# Patient Record
Sex: Male | Born: 1985 | Race: White | Hispanic: No | Marital: Single | Smoking: Current every day smoker
Health system: Southern US, Community
[De-identification: ages and names within clinical notes are randomized; demographics above are authoritative.]

## PROBLEM LIST (undated history)

## (undated) DIAGNOSIS — F845 Asperger's syndrome: Secondary | ICD-10-CM

## (undated) DIAGNOSIS — R55 Syncope and collapse: Secondary | ICD-10-CM

## (undated) HISTORY — DX: Syncope and collapse: R55

## (undated) HISTORY — PX: OTHER SURGICAL HISTORY: SHX169

---

## 2011-06-19 ENCOUNTER — Emergency Department: Payer: Self-pay | Admitting: *Deleted

## 2011-07-14 ENCOUNTER — Emergency Department: Payer: Self-pay | Admitting: Emergency Medicine

## 2012-01-09 ENCOUNTER — Emergency Department: Payer: Self-pay | Admitting: Emergency Medicine

## 2012-04-16 ENCOUNTER — Emergency Department: Payer: Self-pay | Admitting: *Deleted

## 2013-08-23 ENCOUNTER — Emergency Department: Payer: Self-pay | Admitting: Emergency Medicine

## 2013-09-08 ENCOUNTER — Emergency Department: Payer: Self-pay | Admitting: Emergency Medicine

## 2014-04-02 LAB — HM HIV SCREENING LAB: HM HIV Screening: NEGATIVE

## 2015-10-27 ENCOUNTER — Encounter: Payer: Self-pay | Admitting: Medical Oncology

## 2015-10-27 ENCOUNTER — Emergency Department
Admission: EM | Admit: 2015-10-27 | Discharge: 2015-10-27 | Disposition: A | Discharge status: Home / Self Care | Payer: Self-pay | Attending: Emergency Medicine | Admitting: Emergency Medicine

## 2015-10-27 DIAGNOSIS — R112 Nausea with vomiting, unspecified: Secondary | ICD-10-CM

## 2015-10-27 DIAGNOSIS — F17200 Nicotine dependence, unspecified, uncomplicated: Secondary | ICD-10-CM | POA: Insufficient documentation

## 2015-10-27 DIAGNOSIS — K529 Noninfective gastroenteritis and colitis, unspecified: Secondary | ICD-10-CM | POA: Insufficient documentation

## 2015-10-27 DIAGNOSIS — R197 Diarrhea, unspecified: Secondary | ICD-10-CM

## 2015-10-27 LAB — COMPREHENSIVE METABOLIC PANEL
ALBUMIN: 4.2 g/dL (ref 3.5–5.0)
ALK PHOS: 63 U/L (ref 38–126)
ALT: 20 U/L (ref 17–63)
ANION GAP: 4 — AB (ref 5–15)
AST: 21 U/L (ref 15–41)
BUN: 15 mg/dL (ref 6–20)
CALCIUM: 9.3 mg/dL (ref 8.9–10.3)
CO2: 25 mmol/L (ref 22–32)
Chloride: 108 mmol/L (ref 101–111)
Creatinine, Ser: 0.94 mg/dL (ref 0.61–1.24)
GFR calc Af Amer: >60 mL/min (ref >60)
GFR calc non Af Amer: >60 mL/min (ref >60)
GLUCOSE: 97 mg/dL (ref 65–99)
POTASSIUM: 4.3 mmol/L (ref 3.5–5.1)
SODIUM: 137 mmol/L (ref 135–145)
Total Bilirubin: 0.4 mg/dL (ref 0.3–1.2)
Total Protein: 7.5 g/dL (ref 6.5–8.1)

## 2015-10-27 LAB — URINALYSIS COMPLETE WITH MICROSCOPIC (ARMC ONLY)
BACTERIA UA: NONE SEEN
Bilirubin Urine: NEGATIVE
Glucose, UA: NEGATIVE mg/dL
HGB URINE DIPSTICK: NEGATIVE
Ketones, ur: NEGATIVE mg/dL
LEUKOCYTES UA: NEGATIVE
NITRITE: NEGATIVE
PH: 7 (ref 5.0–8.0)
PROTEIN: NEGATIVE mg/dL
SPECIFIC GRAVITY, URINE: 1.017 (ref 1.005–1.030)

## 2015-10-27 LAB — CBC
HEMATOCRIT: 43.3 % (ref 40.0–52.0)
HEMOGLOBIN: 15.1 g/dL (ref 13.0–18.0)
MCH: 30.2 pg (ref 26.0–34.0)
MCHC: 34.8 g/dL (ref 32.0–36.0)
MCV: 86.8 fL (ref 80.0–100.0)
Platelets: 212 10*3/uL (ref 150–440)
RBC: 4.99 MIL/uL (ref 4.40–5.90)
RDW: 13.1 % (ref 11.5–14.5)
WBC: 6.9 10*3/uL (ref 3.8–10.6)

## 2015-10-27 LAB — LIPASE, BLOOD: Lipase: 21 U/L (ref 11–51)

## 2015-10-27 MED ORDER — SODIUM CHLORIDE 0.9 % IV BOLUS (SEPSIS): 1000.0000 mL | Freq: Once | INTRAVENOUS | Status: DC

## 2015-10-27 MED ORDER — ONDANSETRON HCL 4 MG/2ML IJ SOLN: 4.0000 mg | Freq: Once | INTRAMUSCULAR | Status: DC

## 2015-10-27 MED ORDER — LOPERAMIDE HCL 2 MG PO TABS: 2.0000 mg | ORAL_TABLET | Freq: Four times a day (QID) | ORAL | Status: DC | PRN

## 2015-10-27 MED ORDER — LOPERAMIDE HCL 2 MG PO CAPS
4.0000 mg | ORAL_CAPSULE | Freq: Once | ORAL | Status: DC
  Filled 2015-10-27: qty 2

## 2015-10-27 MED ORDER — ONDANSETRON 4 MG PO TBDP: 4.0000 mg | ORAL_TABLET | Freq: Three times a day (TID) | ORAL | Status: DC | PRN

## 2015-10-27 NOTE — ED Notes (Signed)
Pt reports nvd, headache and abd pain since Thursday night.

## 2015-10-27 NOTE — Discharge Instructions (Signed)

## 2015-10-27 NOTE — ED Notes (Signed)
MD at bedside. 

## 2015-10-27 NOTE — ED Provider Notes (Addendum)
[facility] Emergency Department Provider Note  Time seen: 3:58 PM  I have reviewed the triage vital signs and the nursing notes.   HISTORY  Chief Complaint Diarrhea; Headache; and Emesis    HPI Paul Booker is a 30 y.o. male with no past medical history who presents the emergency department 3 days of nausea, vomiting, diarrhea. According to the patient his wife came down with nausea, vomiting, diarrhea last week, she was just getting over it when he began with nausea, vomiting, diarrhea. States the vomiting has largely resolved until today when he was at work at an episode of vomiting so they sent him home. Continues to state very watery diarrhea. States mild epigastric pain. Denies any chest pain, cough, trouble breathing. Does state mild headache but he believes is due to the vomiting and dehydration.     History reviewed. No pertinent past medical history.  There are no active problems to display for this patient.   No past surgical history on file.  No current outpatient prescriptions on file.  Allergies Review of patient's allergies indicates no known allergies.  No family history on file.  Social History Social History  Substance Use Topics  . Smoking status: Current Every Day Smoker  . Smokeless tobacco: None  . Alcohol Use: None    Review of Systems Constitutional: Negative for fever Cardiovascular: Negative for chest pain. Respiratory: Negative for shortness of breath. Gastrointestinal: Mild epigastric pain. Positive for nausea, vomiting, diarrhea Genitourinary: Negative for dysuria. Neurological: Negative for headache 10-point ROS otherwise negative.  ____________________________________________   PHYSICAL EXAM:  VITAL SIGNS: ED Triage Vitals  Enc Vitals Group     BP 10/27/15 1503 105/57 mmHg     Pulse Rate 10/27/15 1503 79     Resp 10/27/15 1503 18     Temp 10/27/15 1503 98.2 F (36.8 C)     Temp Source 10/27/15 1503  Oral     SpO2 10/27/15 1503 98 %     Weight 10/27/15 1503 170 lb (77.111 kg)     Height 10/27/15 1503  (1.905 m)     Head Cir --      Peak Flow --      Pain Score 10/27/15 1504 1     Pain Loc --      Pain Edu? --      Excl. in GC? --     Constitutional: Alert and oriented. Well appearing and in no distress. Eyes: Normal exam ENT   Head: Normocephalic and atraumatic.   Mouth/Throat: Mucous membranes are moist. Cardiovascular: Normal rate, regular rhythm. No murmur Respiratory: Normal respiratory effort without tachypnea nor retractions. Breath sounds are clear  Gastrointestinal: Soft, mild epigastric tenderness. No rebound or guarding. No distention. Musculoskeletal: Nontender with normal range of motion in all extremities. No lower extremity tenderness or edema. Neurologic:  Normal speech and language. No gross focal neurologic deficits  Skin:  Skin is warm, dry and intact.  Psychiatric: Mood and affect are normal. Speech and behavior are normal.   ____________________________________________    INITIAL IMPRESSION / ASSESSMENT AND PLAN / ED COURSE  Pertinent labs & imaging results that were available during my care of the patient were reviewed by me and considered in my medical decision making (see chart for details).  Patient presents the emergency department nausea, vomiting, diarrhea. States his wife had similar symptoms just before he began with his symptoms. Suspect likely gastroenteritis, possibly normal virus. We will IV hydrate, treat with Zofran, and loperamide.  Overall the patient appears well. Moist mucous membranes on exam.  Labs are normal. We'll discharge home with Zofran and loperamide. Patient agreeable to plan.  ____________________________________________   FINAL CLINICAL IMPRESSION(S) / ED DIAGNOSES  Gastroenteritis Nausea vomiting diarrhea   Minna AntisKevin Mertie Haslem, MD 10/27/15 1600  Minna AntisKevin Stephonie Wilcoxen, MD 10/27/15 [phone]

## 2016-04-08 ENCOUNTER — Emergency Department
Admission: EM | Admit: 2016-04-08 | Discharge: 2016-04-08 | Disposition: A | Discharge status: Home / Self Care | Payer: Self-pay | Attending: Emergency Medicine | Admitting: Emergency Medicine

## 2016-04-08 ENCOUNTER — Emergency Department: Payer: Self-pay

## 2016-04-08 ENCOUNTER — Encounter: Payer: Self-pay | Admitting: Emergency Medicine

## 2016-04-08 DIAGNOSIS — F17200 Nicotine dependence, unspecified, uncomplicated: Secondary | ICD-10-CM | POA: Insufficient documentation

## 2016-04-08 DIAGNOSIS — R55 Syncope and collapse: Secondary | ICD-10-CM | POA: Insufficient documentation

## 2016-04-08 DIAGNOSIS — W1839XA Other fall on same level, initial encounter: Secondary | ICD-10-CM | POA: Insufficient documentation

## 2016-04-08 DIAGNOSIS — S01419A Laceration without foreign body of unspecified cheek and temporomandibular area, initial encounter: Secondary | ICD-10-CM | POA: Insufficient documentation

## 2016-04-08 DIAGNOSIS — Z23 Encounter for immunization: Secondary | ICD-10-CM | POA: Insufficient documentation

## 2016-04-08 DIAGNOSIS — S0181XA Laceration without foreign body of other part of head, initial encounter: Secondary | ICD-10-CM

## 2016-04-08 DIAGNOSIS — Y93E5 Activity, floor mopping and cleaning: Secondary | ICD-10-CM | POA: Insufficient documentation

## 2016-04-08 DIAGNOSIS — S0990XA Unspecified injury of head, initial encounter: Secondary | ICD-10-CM | POA: Insufficient documentation

## 2016-04-08 DIAGNOSIS — Y92000 Kitchen of unspecified non-institutional (private) residence as  the place of occurrence of the external cause: Secondary | ICD-10-CM | POA: Insufficient documentation

## 2016-04-08 DIAGNOSIS — Y990 Civilian activity done for income or pay: Secondary | ICD-10-CM | POA: Insufficient documentation

## 2016-04-08 MED ORDER — TETANUS-DIPHTH-ACELL PERTUSSIS 5-2.5-18.5 LF-MCG/0.5 IM SUSP
0.5000 mL | Freq: Once | INTRAMUSCULAR | Status: AC
  Administered 2016-04-08: 0.5 mL via INTRAMUSCULAR
  Filled 2016-04-08: qty 0.5

## 2016-04-08 MED ORDER — LIDOCAINE HCL (PF) 1 % IJ SOLN
INTRAMUSCULAR | Status: AC
  Filled 2016-04-08: qty 5

## 2016-04-08 NOTE — ED Notes (Signed)
Pt to ed with c/o laceration/puncture wound to forehead, bleeding controlled.  Pt reports he was at work but not clocked in and hit his head on the refrigerator.  Pt alert and oriented.  Admits to using marijuana and reports "I do not want it to be filed worker's comp because I will lose my job"  Pt behavior within normal limits.  Pupils equal and reactive.

## 2016-04-08 NOTE — ED Notes (Signed)
Pt to ct scan.  Alert and ambulatory.

## 2016-04-08 NOTE — Discharge Instructions (Signed)
You have been seen today in the Emergency Department (ED)  for syncope (passing out).  Your workup including labs and EKG show reassuring results.  Your symptoms may be due to mild dehydration, so it is important that you drink plenty of non-alcoholic fluids.  Given that you have had all to pull episodes of passing out in the past, we recommend that you contact the cardiologist listed in this documentation to schedule a follow-up appointment.  Additionally you should set up a primary care doctor for all of your health care needs.  Read through the included information and have her sutures removed in 5-7 days.  Return immediately to the emergency department if you are worried about infection or if you develop any new or worsening symptoms that concern you.

## 2016-04-08 NOTE — ED Provider Notes (Signed)
[facility] Emergency Department Provider Note  ____________________________________________   First MD Initiated Contact with Patient 04/08/16 1652     (approximate)  I have reviewed the triage vital signs and the nursing notes.   HISTORY  Chief Complaint Head Laceration and Loss of Consciousness    HPI Paul Booker is a 30 y.o. male with no significant past medical history who presents for evaluation of a head injury with laceration to his forehead after a loss of consciousness.He reports that he has had multiple syncopal episodes in the past, usually when exerting himself.  This is been happening for years.  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  He states that the next thing he knew he was falling and hit his forehead on the floor.  He has a laceration to his forehead but he is alert and oriented at this time.  He stated that everything happened very quickly.  The pain in his forehead as moderate.  He has no pain in his neck, chest, abdomen, or pelvis.  He did not sustain any wounds to his hands.  He is not up-to-date on his tetanus vaccination for at least 10 years.  The bleeding is well-controlled at this time.   History reviewed. No pertinent past medical history.  There are no active problems to display for this patient.   History reviewed. No pertinent surgical history.  Prior to Admission medications   Medication Sig Start Date End Date Taking? Authorizing Provider  loperamide (IMODIUM A-D) 2 MG tablet Take 1 tablet (2 mg total) by mouth 4 (four) times daily as needed for diarrhea or loose stools. 10/27/15   Minna Antis, MD  ondansetron (ZOFRAN ODT) 4 MG disintegrating tablet Take 1 tablet (4 mg total) by mouth every 8 (eight) hours as needed for nausea or vomiting. 10/27/15   Minna Antis, MD    Allergies Review of patient's allergies indicates no  known allergies.  History reviewed. No pertinent family history.  Social History Social History  Substance Use Topics  . Smoking status: Current Every Day Smoker  . Smokeless tobacco: Never Used  . Alcohol use No  Uses marijuana frequently.  Review of Systems Constitutional: No fever/chills Eyes: No visual changes. ENT: No sore throat. Cardiovascular: Denies chest pain. +syncope prior to fall Respiratory: Denies shortness of breath. Gastrointestinal: No abdominal pain.  No nausea, no vomiting.  No diarrhea.  No constipation. Genitourinary: Negative for dysuria. Musculoskeletal: Negative for back pain. Pain in forehead after fall w/ head injury Skin: Negative for rash. Neurological: Negative for headaches, focal weakness or numbness.  10-point ROS otherwise negative.  ____________________________________________   PHYSICAL EXAM:  VITAL SIGNS: ED Triage Vitals [04/08/16 1639]  Enc Vitals Group     BP 96/82     Pulse Rate 74     Resp 18     Temp 97.8 F (36.6 C)     Temp Source Oral     SpO2 99 %     Weight 180 lb (81.6 kg)     Height 6\' 3"  (1.905 m)     Head Circumference      Peak Flow      Pain Score      Pain Loc      Pain Edu?      Excl. in GC?     Constitutional: Alert and oriented. Well appearing and in no acute distress.  Eyes: Conjunctivae are normal. PERRL. EOMI. Head: Bruising and a laceration to the right side of his forehead.  See the procedure note for details regarding the laceration. Nose: No congestion/rhinnorhea. Mouth/Throat: Mucous membranes are moist.  Oropharynx non-erythematous. Neck: No stridor.  No meningeal signs.  No cervical spine tenderness to palpation. Cardiovascular: Normal rate, regular rhythm. Good peripheral circulation. Grossly normal heart sounds. Respiratory: Normal respiratory effort.  No retractions. Lungs CTAB. Gastrointestinal: Soft and nontender. No distention.  Musculoskeletal: No lower extremity tenderness nor edema.  No gross deformities of extremities. Neurologic:  Normal speech and language. No gross focal neurologic deficits are appreciated.  Skin:  Skin is warm, dry and intact. No rash noted. Psychiatric: Mood and affect are normal. Speech and behavior are normal.  ____________________________________________   LABS (all labs ordered are listed, but only abnormal results are displayed)  Labs Reviewed - No data to display ____________________________________________  EKG  ED ECG REPORT I, Nusrat Encarnacion, the attending physician, personally viewed and interpreted this ECG.  Date: 04/08/2016 EKG Time: 19:20 Rate: 58 Rhythm: Borderline sinus bradycardia QRS Axis: normal Intervals: normal ST/T Wave abnormalities: normal Conduction Disturbances: none Narrative Interpretation: unremarkable with no evidence of Brugada type I, 2, nor 3  ____________________________________________  RADIOLOGY   Ct Head Wo Contrast  Result Date: 04/08/2016 CLINICAL DATA:  Syncopal episode EXAM: CT HEAD WITHOUT CONTRAST TECHNIQUE: Contiguous axial images were obtained from the base of the skull through the vertex without intravenous contrast. COMPARISON:  07/03/2014 FINDINGS: Brain: No evidence of acute infarction, hemorrhage, hydrocephalus, extra-axial collection or mass lesion/mass effect. Vascular: No hyperdense vessel or unexpected calcification. Skull: Normal. Negative for fracture or focal lesion. Sinuses/Orbits: No acute finding. Other: Right frontal scalp injury is noted. IMPRESSION: Right frontal scalp injury. No acute intracranial abnormality is noted. Electronically Signed   By: Alcide CleverMark  Lukens M.D.   On: 04/08/2016 17:58    ____________________________________________   PROCEDURES  Procedure(s) performed:   Marland Kitchen.Marland Kitchen.Laceration Repair Date/Time: 04/08/2016 8:55 PM Performed by: Loleta RoseFORBACH, Dajanee Voorheis Authorized by: Loleta RoseFORBACH, Ena Demary   Consent:    Consent obtained:  Verbal   Consent given by:  Patient   Risks  discussed:  Infection, pain, retained foreign body, poor cosmetic result and poor wound healing Anesthesia (see MAR for exact dosages):    Anesthesia method:  Local infiltration   Local anesthetic:  Lidocaine 1% w/o epi Laceration details:    Location:  Face   Face location:  Forehead (middle, just above nose)   Length (cm):  1.9 (stellate - 1.9 is longest dimension) Repair type:    Repair type:  Simple Exploration:    Hemostasis achieved with:  Direct pressure   Wound exploration: entire depth of wound probed and visualized     Contaminated: no   Treatment:    Area cleansed with:  Saline   Amount of cleaning:  Extensive   Irrigation solution:  Sterile saline   Visualized foreign bodies/material removed: no   Skin repair:    Repair method:  Sutures and tissue adhesive (applied tissue adhesive to remaining areas after 3 sutures to hold together stellate laceration)   Suture size:  4-0   Suture material:  Nylon   Suture technique:  Simple interrupted   Number of sutures:  3 Approximation:    Approximation:  Close   Vermilion border: well-aligned   Post-procedure details:    Dressing:  Sterile dressing   Patient tolerance of procedure:  Tolerated well, no immediate complications      Critical Care performed:  No ____________________________________________   INITIAL IMPRESSION / ASSESSMENT AND PLAN / ED COURSE  Pertinent labs & imaging results that were available during my care of the patient were reviewed by me and considered in my medical decision making (see chart for details).  Given the history and nature of the injury, will obtain head CT.  Giving Tdap, will suture wound.  Need EKG given history of multiple syncopal episodes with exertion; need to evaluate for Brugada.   Clinical Course  Comment By Time  The patient's CT scan was unremarkable.  His EKG was also unremarkable with no evidence of Brugada syndrome or other potentially life-threatening cause of syncope.   He has had these episodes multiple times in the past and I encouraged him to follow up with cardiologist and to establish primary care doctor.  I gave him my usual and customary sutured wound precautions and head injury precautions.  He and his girlfriend understand and agree with the plan. Loleta Rose, MD 09/06 2058    ____________________________________________  FINAL CLINICAL IMPRESSION(S) / ED DIAGNOSES  Final diagnoses:  Facial laceration, initial encounter  Syncope and collapse  Head injury, initial encounter     MEDICATIONS GIVEN DURING THIS VISIT:  Medications  lidocaine (PF) (XYLOCAINE) 1 % injection (not administered)  Tdap (BOOSTRIX) injection 0.5 mL (0.5 mLs Intramuscular Given 04/08/16 1735)     NEW OUTPATIENT MEDICATIONS STARTED DURING THIS VISIT:  New Prescriptions   No medications on file    Modified Medications   No medications on file    Discontinued Medications   No medications on file     Note:  This document was prepared using Dragon voice recognition software and may include unintentional dictation errors.    Loleta Rose, MD 04/08/16 2102

## 2016-04-08 NOTE — ED Notes (Signed)
Discharge instructions reviewed with patient. Questions fielded by this RN. Patient verbalizes understanding of instructions. Patient discharged home in stable condition per Forbach MD . No acute distress noted at time of discharge.   

## 2016-04-08 NOTE — ED Triage Notes (Signed)
Pt was at work when he thinks he had a syncopal episode.  He hit is head on something. He is unclear about how he fell. Pt is poor historian at this time.  Pt states he was not clocked in at the time of the accident. Pt tearful in triage. Pt states he did "take something" but he is not saying what. Pt states that he feels that what he smoked should be legal.

## 2016-04-09 ENCOUNTER — Telehealth: Payer: Self-pay

## 2016-04-09 NOTE — Telephone Encounter (Signed)
lmov to call back and schedule ED visit on 04/08/16  Seen for Loss of conscious

## 2016-04-20 ENCOUNTER — Ambulatory Visit: Payer: Self-pay | Admitting: Cardiology

## 2016-04-27 ENCOUNTER — Ambulatory Visit (INDEPENDENT_AMBULATORY_CARE_PROVIDER_SITE_OTHER): Payer: Self-pay | Admitting: Cardiology

## 2016-04-27 ENCOUNTER — Encounter: Payer: Self-pay | Admitting: Cardiology

## 2016-04-27 VITALS — BP 121/77 | HR 77 | Ht 75.0 in | Wt 176.8 lb

## 2016-04-27 DIAGNOSIS — I951 Orthostatic hypotension: Secondary | ICD-10-CM

## 2016-04-27 DIAGNOSIS — Z7189 Other specified counseling: Secondary | ICD-10-CM

## 2016-04-27 DIAGNOSIS — R55 Syncope and collapse: Secondary | ICD-10-CM

## 2016-04-27 DIAGNOSIS — Z7689 Persons encountering health services in other specified circumstances: Secondary | ICD-10-CM

## 2016-04-27 NOTE — Progress Notes (Signed)
Cardiology Office Note   Date:  04/28/2016   ID:  Paul SalesJacob Savoca, DOB [birth year], MRN [id number]  Referring Doctor:  No PCP Per Patient   Cardiologist:   Almond LintAileen Jibril Mcminn, MD   Reason for consultation:  Chief Complaint  Patient presents with  . Establish Care   Sent by ER for evaluation for loss of consciousness   History of Present Illness: Paul Booker is a 30 y.o. male who presents for episodes of passing out. Patient is a poor historian. He reports that his problems started when he was age 30. He's had several episodes of passing out, most of these were unwitnessed. With help from girlfriend who is present with him at this visit, there is a description of awareness that patient will be passing out momentarily. He describes not being able to help it. On some episodes, he reports that his vision turns dark/tunnel vision before he passes out. When it strikes, he falls to the ground and is noted to have jerking movements all over his body. He denies having chest tightness or shortness of breath or palpitations prior to the episodes. The girlfriend has seen him multiple times sitting down in a corner in the shower seemingly out of it.  He does not routinely go to a physician. This problem has never been formally evaluated.   He thinks that he is not really drinking enough fluids in the day. He does not eat breakfast. 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 He barely eats  anything for dinner.  Per the girlfriend, he sleeps at night probably only for 2 hours. He mostly stays up and watches videos on youtube. He has done this for many years.  ROS:  Please see the history of present illness. Aside from mentioned under HPI, all other systems are reviewed and negative.     Past Medical History:  Diagnosis Date  . Syncope and collapse     Past Surgical History:  Procedure Laterality Date  . adnoids removed       reports that he has been smoking Cigarettes.   He has a 11.00 pack-year smoking history. He has never used smokeless tobacco. He reports that he drinks alcohol. He reports that he uses drugs, including Marijuana.   family history includes Asthma in his brother; Diabetes in his father; Heart disease in his mother; Hypertension in his mother.   Outpatient Medications Prior to Visit  Medication Sig Dispense Refill  . loperamide (IMODIUM A-D) 2 MG tablet Take 1 tablet (2 mg total) by mouth 4 (four) times daily as needed for diarrhea or loose stools. 20 tablet 0  . ondansetron (ZOFRAN ODT) 4 MG disintegrating tablet Take 1 tablet (4 mg total) by mouth every 8 (eight) hours as needed for nausea or vomiting. 20 tablet 0   No facility-administered medications prior to visit.      Allergies: Review of patient's allergies indicates no known allergies.    PHYSICAL EXAM: VS:  BP 121/77 (BP Location: Left Arm, Patient Position: Sitting, Cuff Size: Normal)   Pulse 77   Ht 6\' 3"  (1.905 m)   Wt 176 lb 12.8 oz (80.2 kg)   SpO2 96%   BMI 22.10 kg/m  , Body mass index is 22.1 kg/m. Wt Readings from Last 3 Encounters:  04/27/16 176 lb 12.8 oz (80.2 kg)  04/08/16 180 lb (81.6 kg)  10/27/15 170 lb (77.1 kg)    Orthostatic VS for the past 24  hrs:  BP- Lying Pulse- Lying BP- Sitting Pulse- Sitting BP- Standing at 0 minutes Pulse- Standing at 0 minutes  04/27/16 1554 130/69 57 133/61 80 110/65 58      GENERAL:  well developed, well nourished, Tall and lanky, not in acute distress HEENT: normocephalic, pink conjunctivae, anicteric sclerae, no xanthelasma, normal dentition, oropharynx clear NECK:  no neck vein engorgement, JVP normal, no hepatojugular reflux, carotid upstroke brisk and symmetric, no bruit, no thyromegaly, no lymphadenopathy LUNGS:  good respiratory effort, clear to auscultation bilaterally CV:  PMI not displaced, no thrills, no lifts, S1 and S2 within normal limits, no palpable S3 or S4, no murmurs, no rubs, no gallops ABD:  Soft,  nontender, nondistended, normoactive bowel sounds, no abdominal aortic bruit, no hepatomegaly, no splenomegaly MS: nontender back, no kyphosis, no scoliosis, no joint deformities EXT:  2+ DP/PT pulses, no edema, no varicosities, no cyanosis, no clubbing SKIN: warm, nondiaphoretic, normal turgor, no ulcers NEUROPSYCH: alert, oriented to person, place, and time, sensory/motor grossly intact, normal mood, appropriate affect  Recent Labs: 10/27/2015: ALT 20; BUN 15; Creatinine, Ser 0.94; Hemoglobin 15.1; Platelets 212; Potassium 4.3; Sodium 137   Lipid Panel No results found for: CHOL, TRIG, HDL, CHOLHDL, VLDL, LDLCALC, LDLDIRECT   Other studies Reviewed:  EKG:  The ekg from 04/27/2016 was personally reviewed by me and it revealed sinus rhythm, 50 BPM, minimal voltage criteria for LVH possible normal variant.  Additional studies/ records that were reviewed personally reviewed by me today include: None available   ASSESSMENT AND PLAN:  Episodes of loss of consciousness Report of jerking movements Absence of chest pain, palpitations prior to episode Evidence of orthostatic change in blood pressure At the very least, recommend echocardiogram to look at heart structure. Recommend that he follow-up with PCP for proper evaluation for possible seizure disorder. He is advised to come back to our office if assessment or evaluation for seizure disorder is negative. At that time, we'll consider treadmill stress test.  He was thoroughly advised on proper nutrition and importance of hydrating himself. Orthostatics changes likely related to poor hydration and poor nutrition. Advised also on importance of getting at least 7 hours of sleep every night. If he does have seizure disorder, poor sleeping habits or lack of sleep is known to aggravate  seizure problem.   Labs/ tests ordered today include:  Orders Placed This Encounter  Procedures  . EKG 12-Lead  . ECHOCARDIOGRAM COMPLETE    I had a  lengthy and detailed discussion with the patient regarding diagnoses, prognosis, diagnostic options, treatment options .   I counseled the patient on importance of lifestyle modification including heart healthy diet, regular physical activity , and smoking cessation. Patient advised the importance of establishing care with PCP and following up on evaluation for possible seizure disorder. Patient verbalized understanding.   Disposition:   FU with undersigned after tests   I spent at least 60 minutes with the patient today and more than 50% of the time was spent counseling the patient and coordinating care.    Signed, Almond Lint, MD  04/28/2016 10:03 AM    [facility]  This note was generated in part with voice recognition software and I apologize for any typographical errors that were not detected and corrected.

## 2016-04-27 NOTE — Patient Instructions (Addendum)
Testing/Procedures: Your physician has requested that you have an echocardiogram. Echocardiography is a painless test that uses sound waves to create images of your heart. It provides your doctor with information about the size and shape of your heart and how well your heart's chambers and valves are working. This procedure takes approximately one hour. There are no restrictions for this procedure.  Follow-Up: Your physician recommends that you follow up with your primary care provider for seizure work up and if that is negative then please come back to see us. We will call you with results of testing.   It was a pleasure seeing you today here in the office. Please do not hesitate to give us a call back if you have any further questions. [phone]  Seneca CellarPamela A. RN, BSN    Keep hydrated, get at least 8 hours of sleep each night, and try to eat a nutritious diet.    Echocardiogram An echocardiogram, or echocardiography, uses sound waves (ultrasound) to produce an image of your heart. The echocardiogram is simple, painless, obtained within a short period of time, and offers valuable information to your health care provider. The images from an echocardiogram can provide information such as:  Evidence of coronary artery disease (CAD).  Heart size.  Heart muscle function.  Heart valve function.  Aneurysm detection.  Evidence of a past heart attack.  Fluid buildup around the heart.  Heart muscle thickening.  Assess heart valve function. LET [facility] Inc[facility]OUR HEALTH CARE PROVIDER KNOW ABOUT:  Any allergies you have.  All medicines you are taking, including vitamins, herbs, eye drops, creams, and over-the-counter medicines.  Previous problems you or members of your family have had with the use of anesthetics.  Any blood disorders you have.  Previous surgeries you have had.  Medical conditions you have.  Possibility of pregnancy, if this applies. BEFORE THE PROCEDURE  No special  preparation is needed. Eat and drink normally.  PROCEDURE   In order to produce an image of your heart, gel will be applied to your chest and a wand-like tool (transducer) will be moved over your chest. The gel will help transmit the sound waves from the transducer. The sound waves will harmlessly bounce off your heart to allow the heart images to be captured in real-time motion. These images will then be recorded.  You may need an IV to receive a medicine that improves the quality of the pictures. AFTER THE PROCEDURE You may return to your normal schedule including diet, activities, and medicines, unless your health care provider tells you otherwise.   This information is not intended to replace advice given to you by your health care provider. Make sure you discuss any questions you have with your health care provider.   Document Released: 07/17/2000 Document Revised: 08/10/2014 Document Reviewed: 03/27/2013 Elsevier Interactive Patient Education [employer]2016 Elsevier Inc.

## 2016-05-25 ENCOUNTER — Other Ambulatory Visit: Payer: Self-pay

## 2016-05-25 ENCOUNTER — Ambulatory Visit (INDEPENDENT_AMBULATORY_CARE_PROVIDER_SITE_OTHER): Payer: Self-pay

## 2016-05-25 DIAGNOSIS — R55 Syncope and collapse: Secondary | ICD-10-CM

## 2017-09-19 ENCOUNTER — Emergency Department: Payer: Self-pay

## 2017-09-19 ENCOUNTER — Emergency Department
Admission: EM | Admit: 2017-09-19 | Discharge: 2017-09-19 | Disposition: A | Discharge status: Home / Self Care | Payer: Self-pay | Attending: Emergency Medicine | Admitting: Emergency Medicine

## 2017-09-19 ENCOUNTER — Other Ambulatory Visit: Payer: Self-pay

## 2017-09-19 DIAGNOSIS — M25572 Pain in left ankle and joints of left foot: Secondary | ICD-10-CM | POA: Insufficient documentation

## 2017-09-19 DIAGNOSIS — R1010 Upper abdominal pain, unspecified: Secondary | ICD-10-CM | POA: Insufficient documentation

## 2017-09-19 DIAGNOSIS — Z7289 Other problems related to lifestyle: Secondary | ICD-10-CM

## 2017-09-19 DIAGNOSIS — F1099 Alcohol use, unspecified with unspecified alcohol-induced disorder: Secondary | ICD-10-CM | POA: Insufficient documentation

## 2017-09-19 DIAGNOSIS — F17210 Nicotine dependence, cigarettes, uncomplicated: Secondary | ICD-10-CM | POA: Insufficient documentation

## 2017-09-19 DIAGNOSIS — Z789 Other specified health status: Secondary | ICD-10-CM

## 2017-09-19 LAB — URINALYSIS, COMPLETE (UACMP) WITH MICROSCOPIC
Bacteria, UA: NONE SEEN
Bilirubin Urine: NEGATIVE
Glucose, UA: NEGATIVE mg/dL
HGB URINE DIPSTICK: NEGATIVE
Ketones, ur: NEGATIVE mg/dL
Leukocytes, UA: NEGATIVE
Nitrite: NEGATIVE
PH: 7 (ref 5.0–8.0)
Protein, ur: NEGATIVE mg/dL
SPECIFIC GRAVITY, URINE: 1.004 — AB (ref 1.005–1.030)

## 2017-09-19 MED ORDER — OMEPRAZOLE 20 MG PO CPDR: 20.0000 mg | DELAYED_RELEASE_CAPSULE | Freq: Every day | ORAL | 0 refills | Status: DC

## 2017-09-19 MED ORDER — MELOXICAM 15 MG PO TABS: 15.0000 mg | ORAL_TABLET | Freq: Every day | ORAL | 2 refills | Status: AC

## 2017-09-19 NOTE — ED Triage Notes (Signed)
Pt state L ankle pain. Denies trauma or injury. Pt states pain woke him up in the middle of the night. Alert, oriented. In wheelchair. Pt didn't take any pain medicine at home.

## 2017-09-19 NOTE — Discharge Instructions (Signed)
Follow-up with your regular doctor if not better in 5-7 days.  Use medication as prescribed.  You have been given an anti-inflammatory for your ankle pain.  Prilosec for your stomach pain.  Cut back on the alcohol intake in your stomach pain may improve.  Drink plenty of fluids such as water.  Return to the emergency department if you are worsening
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
## 2018-01-16 IMAGING — CT CT HEAD W/O CM
3 series · 15 of 46 positions shown, 18 images · non-contrast
Comparison: 07/03/2014

CLINICAL DATA: Syncopal episode

EXAM:
CT HEAD WITHOUT CONTRAST
TECHNIQUE: Contiguous axial images were obtained from the base of the skull
through the vertex without intravenous contrast.

[Series 2: head wo · axial · 0.39mm/px · z∈[-93,+27]mm · 9 of 29 slices shown, 12 images]
[im 3/29  brain]
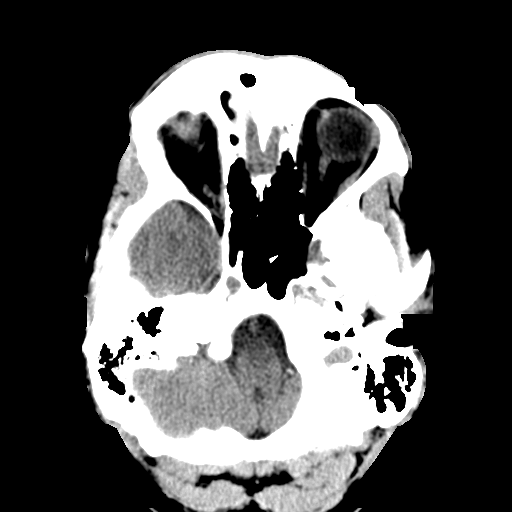
[im 3/29  bone]
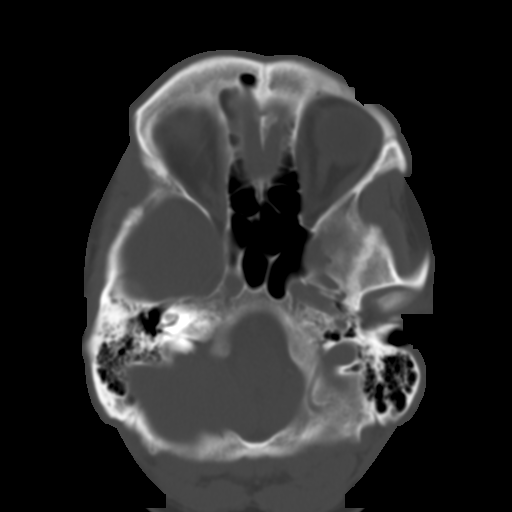
[im 6/29  brain]
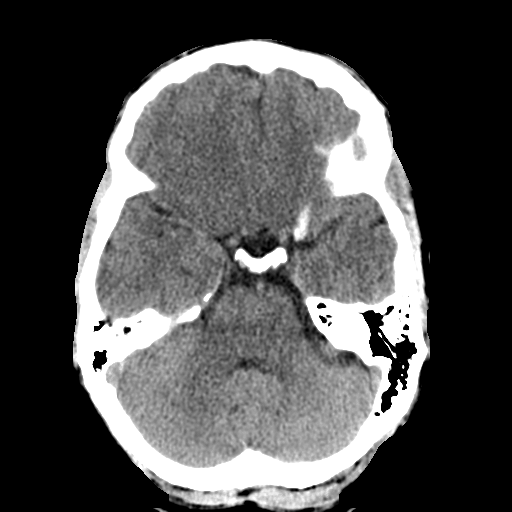
[im 9/29  brain]
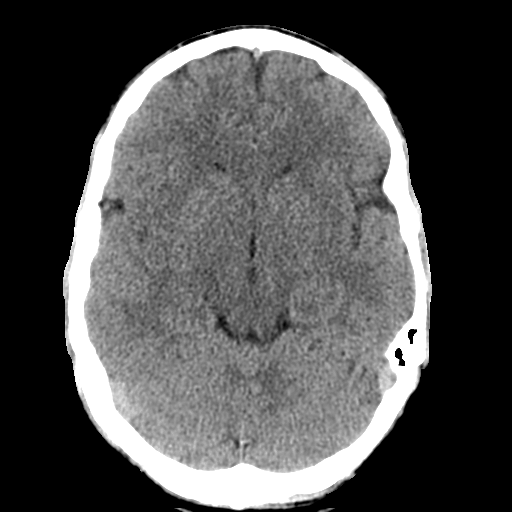
[im 12/29  brain]
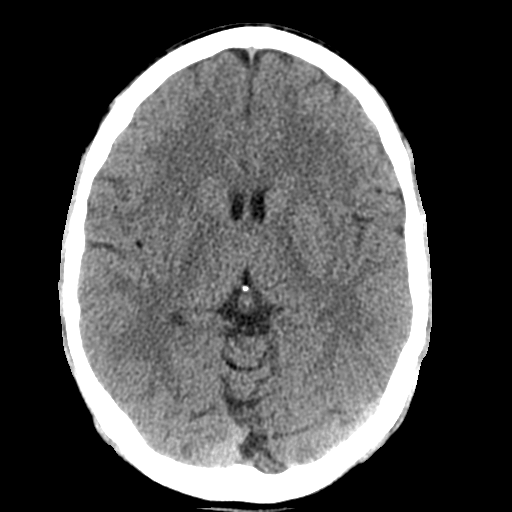
[im 15/29  brain]
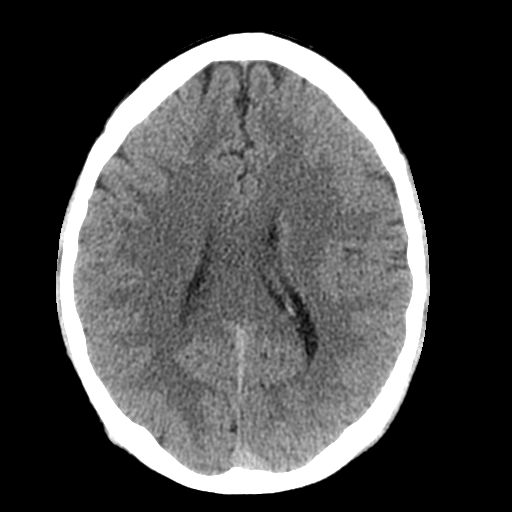
[im 15/29  bone]
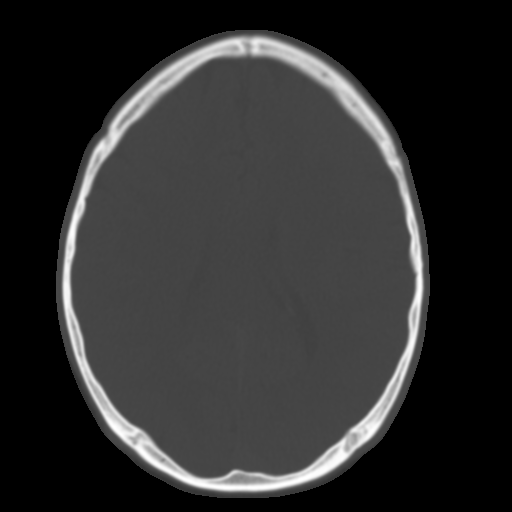
[im 18/29  brain]
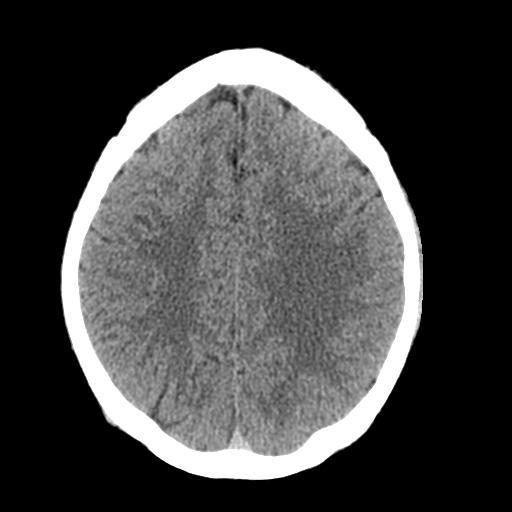
[im 21/29  brain]
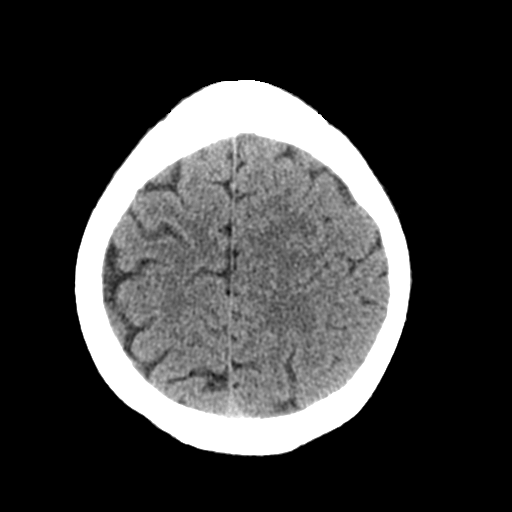
[im 24/29  brain]
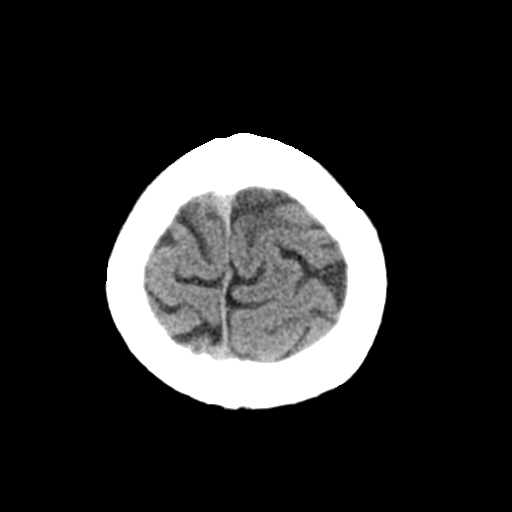
[im 27/29  brain]
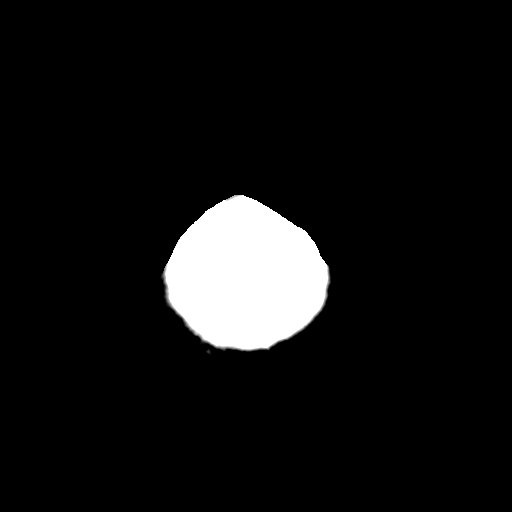
[im 27/29  bone]
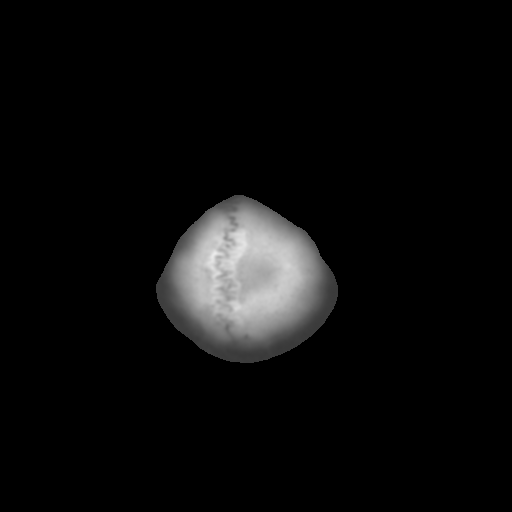

[Series 4: coronal soft tissue · coronal · 0.31mm/px · 3 of 68 slices shown]
[im 23/68  brain]
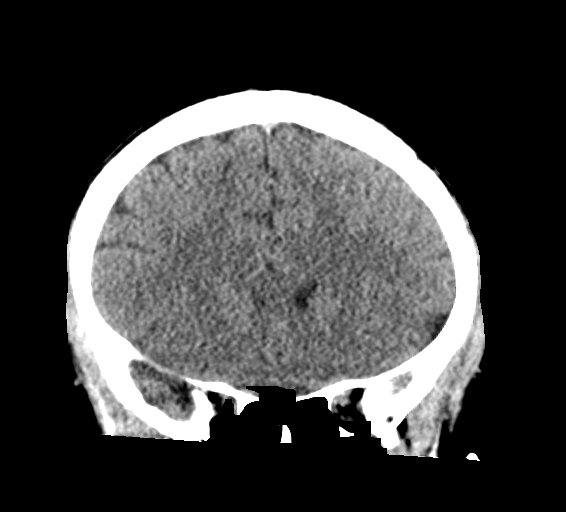
[im 30/68  brain]
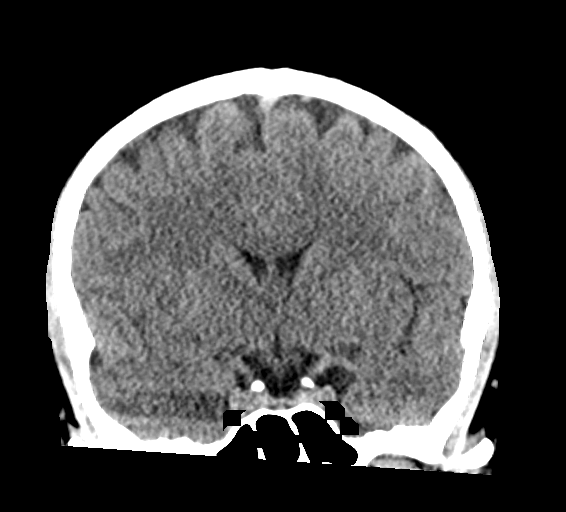
[im 38/68  brain]
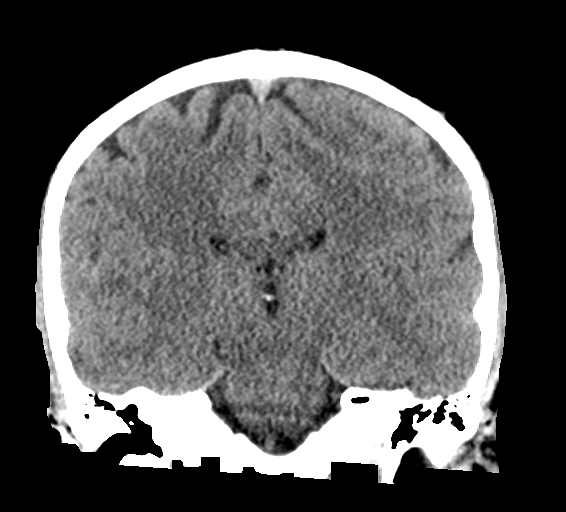

[Series 5: sagittal soft tissue · sagittal · 0.32mm/px · 3 of 55 slices shown]
[im 19/55  brain]
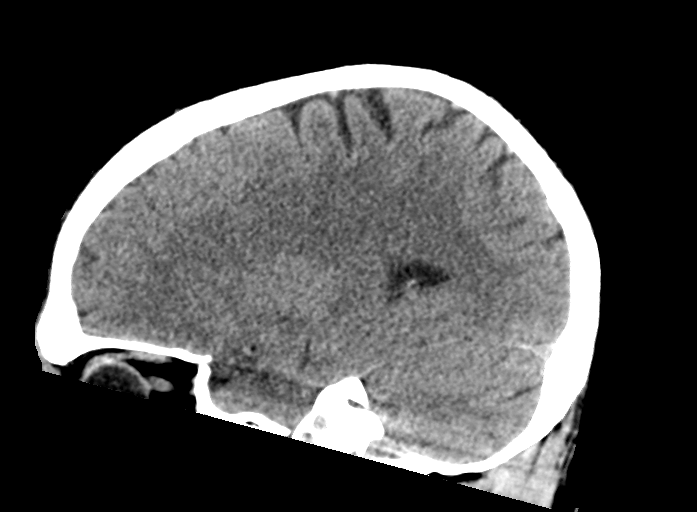
[im 28/55  brain]
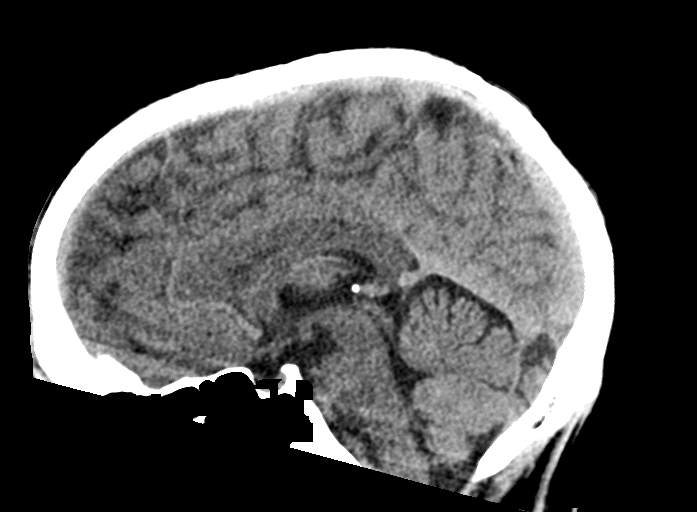
[im 37/55  brain]
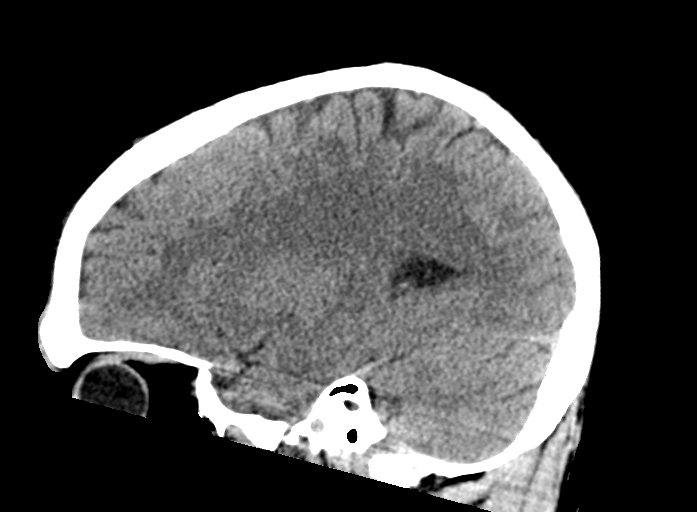

[15 of 46 positions shown; findings below may reference images not displayed]

FINDINGS: Brain: No evidence of acute infarction, hemorrhage, hydrocephalus,
extra-axial collection or mass lesion/mass effect.

Vascular: No hyperdense vessel or unexpected calcification.

Skull: Normal. Negative for fracture or focal lesion.

Sinuses/Orbits: No acute finding.

Other: Right frontal scalp injury is noted.
IMPRESSION: Right frontal scalp injury. No acute intracranial abnormality is
noted.

## 2018-07-06 ENCOUNTER — Encounter: Payer: Self-pay | Admitting: Emergency Medicine

## 2018-07-06 ENCOUNTER — Emergency Department
Admission: EM | Admit: 2018-07-06 | Discharge: 2018-07-06 | Disposition: A | Discharge status: Home / Self Care | Payer: Self-pay | Attending: Emergency Medicine | Admitting: Emergency Medicine

## 2018-07-06 ENCOUNTER — Other Ambulatory Visit: Payer: Self-pay

## 2018-07-06 DIAGNOSIS — K0889 Other specified disorders of teeth and supporting structures: Secondary | ICD-10-CM

## 2018-07-06 DIAGNOSIS — Z79899 Other long term (current) drug therapy: Secondary | ICD-10-CM | POA: Insufficient documentation

## 2018-07-06 DIAGNOSIS — K032 Erosion of teeth: Secondary | ICD-10-CM | POA: Insufficient documentation

## 2018-07-06 DIAGNOSIS — F17210 Nicotine dependence, cigarettes, uncomplicated: Secondary | ICD-10-CM | POA: Insufficient documentation

## 2018-07-06 MED ORDER — TRAMADOL HCL 50 MG PO TABS: 50.0000 mg | ORAL_TABLET | Freq: Four times a day (QID) | ORAL | 0 refills | Status: DC | PRN

## 2018-07-06 MED ORDER — MAGIC MOUTHWASH W/LIDOCAINE: 5.0000 mL | Freq: Four times a day (QID) | ORAL | 0 refills | Status: DC

## 2018-07-06 NOTE — ED Triage Notes (Signed)
Pt c/o stabbing pain to LFT jaw. PT hx of dental pain , slight swelling noted. VSS

## 2018-07-06 NOTE — Discharge Instructions (Signed)
OPTIONS FOR DENTAL FOLLOW UP CARE ° °[place] Department of Health and Human Services - Local Safety Net Dental Clinics °http://www.ncdhhs.gov/dph/oralhealth/services/safetynetclinics.htm °  °[facility] (336-562-3123) ° °[facility] [place] ([phone]) ° °[facility] ([phone] ext [phone]) ° °[facility] ([phone]) ° °[facility] ([phone]) °This clinic caters to the indigent population and is on a lottery system. °Location: °[facility], [address], [place] °Clinic Hours: °Wednesdays from 6pm - 9pm, patients seen by a lottery system. °For dates, call or go to www.med.[facility].edu/[facility]/patients/Dental-[facility] °Services: °Cleanings, fillings and simple extractions. °Payment Options: °DENTAL WORK IS FREE OF CHARGE. Bring proof of income or support. °Best way to get seen: °Arrive at 5:15 pm - this is a lottery, NOT first come/first serve, so arriving earlier will not increase your chances of being seen. °  °  °[facility] °[phone] °Select option 1 for emergencies °  °Location: °[facility], [address], [place] °Clinic Hours: °No walk-ins accepted - call the day before to schedule an appointment. °Check in times are 9:30 am and 1:30 pm. °Services: °Simple extractions, temporary fillings, pulpectomy/pulp debridement, uncomplicated abscess drainage. °Payment Options: °PAYMENT IS DUE AT THE TIME OF SERVICE.  Fee is usually $100-200, additional surgical procedures (e.g. abscess drainage) may be extra. °Cash, checks, Visa/MasterCard accepted.  Can file Medicaid if patient is covered for dental - patient should call case worker to check. °No discount for [facility] patients. °Best way to get seen: °MUST call the day before and get onto the schedule. Can usually be seen the next 1-2 days. No walk-ins accepted. °  °  °[facility] °[phone] °   °Location: °[facility], [address], [place] °Clinic Hours: °M, W, Th, F 8am or 1:30pm, Tues 9a or 1:30 - first come/first served. °Services: °Simple extractions, temporary fillings, uncomplicated abscess drainage.  You do not need to be an [place] resident. °Payment Options: °PAYMENT IS DUE AT THE TIME OF SERVICE. °Dental insurance, otherwise sliding scale - bring proof of income or support. °Depending on income and treatment needed, cost is usually $50-200. °Best way to get seen: °Arrive early as it is first come/first served. °  °  °[facility] °[phone] °  °Location: °[address] °Clinic Hours: °Mon-Thu 8a-5p °Services: °Most basic dental services including extractions and fillings. °Payment Options: °PAYMENT IS DUE AT THE TIME OF SERVICE. °Sliding scale, up to 50% off - bring proof if income or support. °Medicaid with dental option accepted. °Best way to get seen: °Call to schedule an appointment, can usually be seen within 2 weeks OR they will try to see walk-ins - show up at 8a or 2p (you may have to wait). °  °  °[facility] °919-245-2435 °[place] RESIDENTS ONLY °  °Location: °[facility], [address], [place], [place] [address] °Clinic Hours: By appointment only. °Monday - Thursday 8am-5pm, Friday 8am-12pm °Services: Cleanings, fillings, extractions. °Payment Options: °PAYMENT IS DUE AT THE TIME OF SERVICE. °Cash, Visa or MasterCard. Sliding scale - $30 minimum per service. °Best way to get seen: °Come in to office, complete packet and make an appointment - need proof of income °or support monies for each household member and proof of [place] residence. °Usually takes about a month to get in. °  °  °[facility] °[phone] °  °Location: °[address]   [place] °Clinic Hours: Walk-in Urgent Care Dental Services are offered Monday-Friday  mornings only. °The numbers of emergencies accepted daily is limited to the number of °providers available. °Maximum 15 - Mondays, Wednesdays & Thursdays °Maximum 10 - Tuesdays & Fridays °Services: °You do not need to be a [place] resident to be seen for a dental emergency. °Emergencies are defined as pain, swelling, abnormal bleeding, or dental trauma. Walkins will receive x-rays if needed. °NOTE: Dental cleaning is not an emergency. °Payment Options: °PAYMENT IS DUE AT THE TIME OF SERVICE. °Minimum co-pay is $40.00 for uninsured patients. °Minimum co-pay is $3.00 for Medicaid with dental coverage. °Dental Insurance is accepted and must be presented at time of visit. °Medicare does not cover dental. °Forms of payment: Cash, credit card, checks. °Best way to get seen: °If not previously registered with the clinic, walk-in dental registration begins at 7:15 am and is on a first come/first serve basis. °If previously registered with the clinic, call to make an appointment. °  °  °[facility] °[phone] °[place] RESIDENTS ONLY °  °Location: °[address], [place], [place] °Clinic Hours: °Mon-Thu 10a-2p °Services: Extractions only! °Payment Options: °FREE (donations accepted) - bring proof of income or support °Best way to get seen: °Call and schedule an appointment OR come at 8am on the 1st Monday of every month (except for holidays) when it is first come/first served. °  °  °[facility] °[phone] °  °Location: °[address], [place] °Clinic Hours: °Friday mornings °Services, Payment Options, Best way to get seen: °Call for info °

## 2018-07-06 NOTE — ED Provider Notes (Signed)
[facility] Emergency Department Provider Note  ____________________________________________  Time seen: Approximately 6:40 PM  I have reviewed the triage vital signs and the nursing notes.   HISTORY  Chief Complaint Dental Pain    HPI Paul Booker is a 32 y.o. male who presents the emergency department complaining of left upper dental pain.  Patient reports that he has had a tooth "right" to the gumline.  Patient reports that he will have intermittent pain but typically this resolves with ibuprofen.  Patient reports that he is had increased pain to this area.  No discharge.  Patient reports area feels slightly edematous.  No difficulty breathing or swallowing.  No headache.  No trauma.   Patient is requesting information on resources to have remaining tooth extracted.   Past Medical History:  Diagnosis Date  . Syncope and collapse     There are no active problems to display for this patient.   Past Surgical History:  Procedure Laterality Date  . adnoids removed      Prior to Admission medications   Medication Sig Start Date End Date Taking? Authorizing Provider  magic mouthwash w/lidocaine SOLN Take 5 mLs by mouth 4 (four) times daily. 07/06/18   , Delorise Royals, PA-C  meloxicam (MOBIC) 15 MG tablet Take 1 tablet (15 mg total) by mouth daily. 09/19/17 09/19/18  Fisher, Roselyn Bering, PA-C  omeprazole (PRILOSEC) 20 MG capsule Take 1 capsule (20 mg total) by mouth daily. 09/19/17   Fisher, Roselyn Bering, PA-C  traMADol (ULTRAM) 50 MG tablet Take 1 tablet (50 mg total) by mouth every 6 (six) hours as needed. 07/06/18   , Delorise Royals, PA-C    Allergies Patient has no known allergies.  Family History  Problem Relation Age of Onset  . Heart disease Mother   . Hypertension Mother   . Diabetes Father   . Asthma Brother     Social History Social History   Tobacco Use  . Smoking status: Current Every Day Smoker    Packs/day: 1.00    Years: 11.00   Pack years: 11.00    Types: Cigarettes  . Smokeless tobacco: Never Used  Substance Use Topics  . Alcohol use: Yes    Comment: occasional drinker  . Drug use: Yes    Types: Marijuana    Comment: twice a week     Review of Systems  Constitutional: No fever/chills Eyes: No visual changes. No discharge ENT: Positive for left upper dental pain Cardiovascular: no chest pain. Respiratory: no cough. No SOB. Gastrointestinal: No abdominal pain.  No nausea, no vomiting.   Musculoskeletal: Negative for musculoskeletal pain. Skin: Negative for rash, abrasions, lacerations, ecchymosis. Neurological: Negative for headaches, focal weakness or numbness. 10-point ROS otherwise negative.  ____________________________________________   PHYSICAL EXAM:  VITAL SIGNS: ED Triage Vitals  Enc Vitals Group     BP 07/06/18 1722 136/79     Pulse Rate 07/06/18 1721 92     Resp 07/06/18 1721 16     Temp 07/06/18 1721 98.4 F (36.9 C)     Temp Source 07/06/18 1721 Oral     SpO2 07/06/18 1721 98 %     Weight --      Height --      Head Circumference --      Peak Flow --      Pain Score 07/06/18 1721 5     Pain Loc --      Pain Edu? --      Excl. in  GC? --      Constitutional: Alert and oriented. Well appearing and in no acute distress. Eyes: Conjunctivae are normal. PERRL. EOMI. Head: Atraumatic. ENT:      Ears:       Nose: No congestion/rhinnorhea.      Mouth/Throat: Mucous membranes are moist.  Visualization of the oral cavity reveals tooth erosion into the gum at tooth #12.  No surrounding erythema or edema.  No purulent drainage.  Area is very tender to palpation. Neck: No stridor.    Cardiovascular: Normal rate, regular rhythm. Normal S1 and S2.  Good peripheral circulation. Respiratory: Normal respiratory effort without tachypnea or retractions. Lungs CTAB. Good air entry to the bases with no decreased or absent breath sounds. Musculoskeletal: Full range of motion to all extremities.  No gross deformities appreciated. Neurologic:  Normal speech and language. No gross focal neurologic deficits are appreciated.  Skin:  Skin is warm, dry and intact. No rash noted. Psychiatric: Mood and affect are normal. Speech and behavior are normal. Patient exhibits appropriate insight and judgement.   ____________________________________________   LABS (all labs ordered are listed, but only abnormal results are displayed)  Labs Reviewed - No data to display ____________________________________________  EKG   ____________________________________________  RADIOLOGY   No results found.  ____________________________________________    PROCEDURES  Procedure(s) performed:    Procedures    Medications - No data to display   ____________________________________________   INITIAL IMPRESSION / ASSESSMENT AND PLAN / ED COURSE  Pertinent labs & imaging results that were available during my care of the patient were reviewed by me and considered in my medical decision making (see chart for details).  Review of the [place] CSRS was performed in accordance of the [facility] prior to dispensing any controlled drugs.      Patient's diagnosis is consistent with dental pain.  Patient presents emergency department with left upper dental pain.  Visualization reveals erosion of tooth #12 into the gum.  No surrounding erythema or edema concerning for infection.  No visible drainage.  Patient will be prescribed Magic mouthwash for symptom relief as well as very limited prescription of Ultram, given list of dental clinics for complete extraction of remaining tooth root.  Patient declines dental block at this time.  Follow-up with dentist..  Patient is given ED precautions to return to the ED for any worsening or new symptoms.     ____________________________________________  FINAL CLINICAL IMPRESSION(S) / ED DIAGNOSES  Final diagnoses:  Pain, dental      NEW MEDICATIONS STARTED  DURING THIS VISIT:  ED Discharge Orders         Ordered    magic mouthwash w/lidocaine SOLN  4 times daily    Note to Pharmacy:  Dispense in a 1/1/1 ratio. Use lidocaine, diphenhydramine, prednisolone   07/06/18 1849    traMADol (ULTRAM) 50 MG tablet  Every 6 hours PRN     07/06/18 1849              This chart was dictated using voice recognition software/Dragon. Despite best efforts to proofread, errors can occur which can change the meaning. Any change was purely unintentional.    Racheal PatchesCuthriell,  D, PA-C 07/06/18 1850    Don PerkingVeronese, [place], MD 07/07/18 [id number]

## 2018-07-06 NOTE — ED Notes (Signed)
See triage note  Presents with left jaw swelling and dental pain  States sxs' started a few days ago  Pain is worse today

## 2019-02-02 ENCOUNTER — Ambulatory Visit: Payer: Self-pay

## 2019-05-16 ENCOUNTER — Emergency Department
Admission: EM | Admit: 2019-05-16 | Discharge: 2019-05-17 | Disposition: A | Discharge status: Home / Self Care | Payer: Self-pay | Attending: Emergency Medicine | Admitting: Emergency Medicine

## 2019-05-16 ENCOUNTER — Emergency Department: Payer: Self-pay

## 2019-05-16 ENCOUNTER — Encounter: Payer: Self-pay | Admitting: Emergency Medicine

## 2019-05-16 ENCOUNTER — Other Ambulatory Visit: Payer: Self-pay

## 2019-05-16 DIAGNOSIS — F17210 Nicotine dependence, cigarettes, uncomplicated: Secondary | ICD-10-CM | POA: Insufficient documentation

## 2019-05-16 DIAGNOSIS — X503XXA Overexertion from repetitive movements, initial encounter: Secondary | ICD-10-CM | POA: Insufficient documentation

## 2019-05-16 DIAGNOSIS — Y929 Unspecified place or not applicable: Secondary | ICD-10-CM | POA: Insufficient documentation

## 2019-05-16 DIAGNOSIS — Y999 Unspecified external cause status: Secondary | ICD-10-CM | POA: Insufficient documentation

## 2019-05-16 DIAGNOSIS — Z79899 Other long term (current) drug therapy: Secondary | ICD-10-CM | POA: Insufficient documentation

## 2019-05-16 DIAGNOSIS — S93602A Unspecified sprain of left foot, initial encounter: Secondary | ICD-10-CM | POA: Insufficient documentation

## 2019-05-16 DIAGNOSIS — Y9301 Activity, walking, marching and hiking: Secondary | ICD-10-CM | POA: Insufficient documentation
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
## 2019-05-17 ENCOUNTER — Emergency Department: Payer: Self-pay

## 2019-05-17 MED ORDER — ACETAMINOPHEN 500 MG PO TABS
1000.0000 mg | ORAL_TABLET | Freq: Once | ORAL | Status: AC
  Administered 2019-05-17: 1000 mg via ORAL
  Filled 2019-05-17: qty 2

## 2019-05-17 MED ORDER — IBUPROFEN 600 MG PO TABS
600.0000 mg | ORAL_TABLET | Freq: Once | ORAL | Status: AC
  Administered 2019-05-17: 600 mg via ORAL
  Filled 2019-05-17: qty 1
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
## 2019-05-17 NOTE — ED Notes (Signed)
Pt in NAD resting, eyes closed, equal respirations, unlabored breathing. Pt was given a cup of water per request. Nothing more needed from staff at this time

## 2019-05-17 NOTE — Discharge Instructions (Signed)
Your x-rays were negative for fractures.  He can wrap the foot.  You should bear weight as needed.  He can follow-up with your primary care doctor.  Have also given you orthopedics number to follow-up with if it is not getting better within a week.  You take Tylenol 1 g every 8 hours and ibuprofen 600 every 8 hours.

## 2019-05-17 NOTE — ED Notes (Signed)
No obvious deformities or injury noted to Lt heel. Pt lying in bed in NAD. Pt given a blanket for comfort

## 2019-05-17 NOTE — ED Notes (Signed)
Patient resting in bed with eyes closed. Even, unlabored respirations noted. 

## 2019-05-17 NOTE — ED Notes (Addendum)
Signature pad not available at time of discharge. Patient verbalized understanding of discharge instructions and follow up care. Patient taken to parking lot in wheelchair then used crutches to get to car.

## 2019-05-17 NOTE — ED Notes (Signed)
Patient asking to use restroom. States he is unable to put weight on his foot at this time. Patient assisted into wheelchair and taken to restroom.

## 2019-05-17 NOTE — ED Notes (Signed)
Patient fitted for crutches and demonstrated proper use.

## 2019-06-17 ENCOUNTER — Emergency Department: Payer: Self-pay

## 2019-06-17 ENCOUNTER — Emergency Department
Admission: EM | Admit: 2019-06-17 | Discharge: 2019-06-18 | Disposition: A | Discharge status: Home / Self Care | Payer: Self-pay | Attending: Emergency Medicine | Admitting: Emergency Medicine

## 2019-06-17 ENCOUNTER — Other Ambulatory Visit: Payer: Self-pay

## 2019-06-17 DIAGNOSIS — Z79899 Other long term (current) drug therapy: Secondary | ICD-10-CM | POA: Insufficient documentation

## 2019-06-17 DIAGNOSIS — F17210 Nicotine dependence, cigarettes, uncomplicated: Secondary | ICD-10-CM | POA: Insufficient documentation

## 2019-06-17 DIAGNOSIS — F845 Asperger's syndrome: Secondary | ICD-10-CM | POA: Insufficient documentation

## 2019-06-17 DIAGNOSIS — Y906 Blood alcohol level of 120-199 mg/100 ml: Secondary | ICD-10-CM | POA: Insufficient documentation

## 2019-06-17 HISTORY — DX: Asperger's syndrome: F84.5

## 2019-06-17 LAB — COMPREHENSIVE METABOLIC PANEL
ALT: 15 U/L (ref 0–44)
AST: 20 U/L (ref 15–41)
Albumin: 4.4 g/dL (ref 3.5–5.0)
Alkaline Phosphatase: 72 U/L (ref 38–126)
Anion gap: 13 (ref 5–15)
BUN: 10 mg/dL (ref 6–20)
CO2: 24 mmol/L (ref 22–32)
Calcium: 9.3 mg/dL (ref 8.9–10.3)
Chloride: 105 mmol/L (ref 98–111)
Creatinine, Ser: 0.82 mg/dL (ref 0.61–1.24)
GFR calc Af Amer: >60 mL/min (ref >60)
GFR calc non Af Amer: >60 mL/min (ref >60)
Glucose, Bld: 101 mg/dL — ABNORMAL HIGH (ref 70–99)
Potassium: 4.2 mmol/L (ref 3.5–5.1)
Sodium: 142 mmol/L (ref 135–145)
Total Bilirubin: 0.4 mg/dL (ref 0.3–1.2)
Total Protein: 8.4 g/dL — ABNORMAL HIGH (ref 6.5–8.1)

## 2019-06-17 LAB — CBC
HCT: 47.3 % (ref 39.0–52.0)
Hemoglobin: 16.3 g/dL (ref 13.0–17.0)
MCH: 31.2 pg (ref 26.0–34.0)
MCHC: 34.5 g/dL (ref 30.0–36.0)
MCV: 90.4 fL (ref 80.0–100.0)
Platelets: 265 10*3/uL (ref 150–400)
RBC: 5.23 MIL/uL (ref 4.22–5.81)
RDW: 13 % (ref 11.5–15.5)
WBC: 6.7 10*3/uL (ref 4.0–10.5)
nRBC: 0 % (ref 0.0–0.2)

## 2019-06-17 LAB — ETHANOL: Alcohol, Ethyl (B): 159 mg/dL — ABNORMAL HIGH (ref <10)

## 2019-06-17 NOTE — ED Triage Notes (Addendum)
Pt to the ER for IVC with [employer]. Pt therapist called [employer] and told officers pt is intoxicated and becomes dangerous when he is intoxicated. Pt is argumentative with officers in triage. Pt states he went to a church and asked for a phone to call his therapist. Pt is not cooperative and states he is not SI but he is waiting for someone to punch his ticket.
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
## 2019-06-17 NOTE — ED Provider Notes (Signed)
[facility] Emergency Department Provider Note   First MD Initiated Contact with Patient 06/17/19 2333     (approximate)  I have reviewed the triage vital signs and the nursing notes.   HISTORY  Chief Complaint Behavior Problem   HPI Paul Booker is a 33 y.o. male with below list of previous medical conditions presents to the emergency department involuntarily committed arrival secondary to presumed intoxication with concern for possible self injury and possible injury to others.  Patient denies any suicidal or homicidal ideation.  Patient states that he drank 1 beer.  Patient states that he went to the nurse to speak to his therapist.  He states that he was never able to speak to his therapist and that he has no idea why he was committed.       Past Medical History:  Diagnosis Date  . Asperger's disorder   . Syncope and collapse     There are no active problems to display for this patient.   Past Surgical History:  Procedure Laterality Date  . adnoids    . adnoids removed      Prior to Admission medications   Medication Sig Start Date End Date Taking? Authorizing Provider  magic mouthwash w/lidocaine SOLN Take 5 mLs by mouth 4 (four) times daily. 07/06/18   Cuthriell, Charline Bills, PA-C  omeprazole (PRILOSEC) 20 MG capsule Take 1 capsule (20 mg total) by mouth daily. 09/19/17   Fisher, Linden Dolin, PA-C  traMADol (ULTRAM) 50 MG tablet Take 1 tablet (50 mg total) by mouth every 6 (six) hours as needed. 07/06/18   Cuthriell, Charline Bills, PA-C    Allergies Patient has no known allergies.  Family History  Problem Relation Age of Onset  . Heart disease Mother   . Hypertension Mother   . Diabetes Father   . Asthma Brother     Social History Social History   Tobacco Use  . Smoking status: Current Every Day Smoker    Packs/day: 1.00    Years: 11.00    Pack years: 11.00    Types: Cigarettes  . Smokeless tobacco: Never Used  Substance Use Topics   . Alcohol use: Yes    Comment: 1 beer tonight  . Drug use: Yes    Types: Marijuana, Cocaine, Methamphetamines    Comment: twice a week    Review of Systems Constitutional: No fever/chills Eyes: No visual changes. ENT: No sore throat. Cardiovascular: Denies chest pain. Respiratory: Denies shortness of breath. Gastrointestinal: No abdominal pain.  No nausea, no vomiting.  No diarrhea.  No constipation. Genitourinary: Negative for dysuria. Musculoskeletal: Negative for neck pain.  Negative for back pain. Integumentary: Negative for rash. Neurological: Negative for headaches, focal weakness or numbness. Psychiatric:  Involuntarily committed   ____________________________________________   PHYSICAL EXAM:  VITAL SIGNS: ED Triage Vitals [06/17/19 2204]  Enc Vitals Group     BP 140/90     Pulse Rate (!) 108     Resp 18     Temp      Temp Source Oral     SpO2 97 %     Weight 81.6 kg (180 lb)     Height 1.88 m (6\' 2" )     Head Circumference      Peak Flow      Pain Score 0     Pain Loc      Pain Edu?      Excl. in [place]?     Constitutional: Alert and oriented.  Eyes: Conjunctivae are normal.  Mouth/Throat: Patient is wearing a mask. Neck: No stridor.  No meningeal signs.   Cardiovascular: Normal rate, regular rhythm. Good peripheral circulation. Grossly normal heart sounds. Respiratory: Normal respiratory effort.  No retractions. Gastrointestinal: Soft and nontender. No distention.  Musculoskeletal: No lower extremity tenderness nor edema. No gross deformities of extremities. Neurologic:  Normal speech and language. No gross focal neurologic deficits are appreciated.  Skin:  Skin is warm, dry and intact. Psychiatric: Mood and affect are normal. Speech and behavior are normal.  ____________________________________________   LABS (all labs ordered are listed, but only abnormal results are displayed)  Labs Reviewed - No data to display  ____________________________________________   PROCEDURES   Procedure(s) performed (including Critical Care):  Procedures   ____________________________________________   INITIAL IMPRESSION / MDM / ASSESSMENT AND PLAN / ED COURSE  As part of my medical decision making, I reviewed the following data within the electronic MEDICAL RECORD NUMBER   33 year old male presented with above-stated history and physical exam secondary to involuntary commitment due to concern for self-harm and harm to others as well as intoxication.  Patient denies any suicidal or homicidal ideation.  Laboratory data revealed an EtOH level of 159.  Patient evaluated by Dr. Gwenyth Allegra of her psychiatrist who rescinded the patient's IVC and recommended discharge home.      ____________________________________________  FINAL CLINICAL IMPRESSION(S) / ED DIAGNOSES  Final diagnoses:  Asperger's syndrome     MEDICATIONS GIVEN DURING THIS VISIT:  Medications - No data to display   ED Discharge Orders    None      *Please note:  Paul Booker was evaluated in Emergency Department on 06/17/2019 for the symptoms described in the history of present illness. He was evaluated in the context of the global COVID-19 pandemic, which necessitated consideration that the patient might be at risk for infection with the SARS-CoV-2 virus that causes COVID-19. Institutional protocols and algorithms that pertain to the evaluation of patients at risk for COVID-19 are in a state of rapid change based on information released by regulatory bodies including the CDC and federal and state organizations. These policies and algorithms were followed during the patient's care in the ED.  Some ED evaluations and interventions may be delayed as a result of limited staffing during the pandemic.*  Note:  This document was prepared using Dragon voice recognition software and may include unintentional dictation errors.   Darci Current, MD 06/18/19  [phone]

## 2019-06-17 NOTE — ED Notes (Signed)
Pt. States he has been living out of car for the past 2 years.

## 2019-06-18 LAB — URINE DRUG SCREEN, QUALITATIVE (ARMC ONLY)
Amphetamines, Ur Screen: NOT DETECTED
Barbiturates, Ur Screen: NOT DETECTED
Benzodiazepine, Ur Scrn: NOT DETECTED
Cannabinoid 50 Ng, Ur ~~LOC~~: POSITIVE — AB
Cocaine Metabolite,Ur ~~LOC~~: NOT DETECTED
MDMA (Ecstasy)Ur Screen: NOT DETECTED
Methadone Scn, Ur: NOT DETECTED
Opiate, Ur Screen: NOT DETECTED
Phencyclidine (PCP) Ur S: NOT DETECTED
Tricyclic, Ur Screen: NOT DETECTED

## 2019-06-18 NOTE — ED Notes (Signed)
Pt. Completed SOC, [facility] doctor recommends discharge.

## 2019-06-18 NOTE — ED Notes (Signed)
Called patients Mother(Luanne Green)

## 2019-06-18 NOTE — ED Notes (Signed)
Pt. Talking to [facility].

## 2019-06-18 NOTE — ED Notes (Signed)
Pt. Offered ride home by [employer], pt. Refused.  Pt. States "not a far walk".

## 2019-06-18 NOTE — ED Notes (Signed)
Pt. Asked "[employer] officer that brought you in stated he would take you back to your car"  Pt. Stated "I would rather walk home, my car is not far".   Pt. Insists "I would rather walk".  Pt. Given clothes to change back into.

## 2019-06-28 ENCOUNTER — Other Ambulatory Visit: Payer: Self-pay

## 2019-06-28 DIAGNOSIS — Z20822 Contact with and (suspected) exposure to covid-19: Secondary | ICD-10-CM

## 2019-06-29 LAB — NOVEL CORONAVIRUS, NAA: SARS-CoV-2, NAA: NOT DETECTED

## 2019-08-11 ENCOUNTER — Emergency Department
Admission: EM | Admit: 2019-08-11 | Discharge: 2019-08-12 | Disposition: A | Discharge status: Home / Self Care | Payer: Medicaid Other | Attending: Emergency Medicine | Admitting: Emergency Medicine

## 2019-08-11 ENCOUNTER — Other Ambulatory Visit: Payer: Self-pay

## 2019-08-11 DIAGNOSIS — Z590 Homelessness: Secondary | ICD-10-CM | POA: Insufficient documentation

## 2019-08-11 DIAGNOSIS — F329 Major depressive disorder, single episode, unspecified: Secondary | ICD-10-CM | POA: Insufficient documentation

## 2019-08-11 DIAGNOSIS — Z765 Malingerer [conscious simulation]: Secondary | ICD-10-CM

## 2019-08-11 DIAGNOSIS — Z046 Encounter for general psychiatric examination, requested by authority: Secondary | ICD-10-CM | POA: Insufficient documentation

## 2019-08-11 DIAGNOSIS — F4325 Adjustment disorder with mixed disturbance of emotions and conduct: Secondary | ICD-10-CM | POA: Diagnosis present

## 2019-08-11 DIAGNOSIS — F845 Asperger's syndrome: Secondary | ICD-10-CM | POA: Insufficient documentation

## 2019-08-11 DIAGNOSIS — F17210 Nicotine dependence, cigarettes, uncomplicated: Secondary | ICD-10-CM | POA: Insufficient documentation

## 2019-08-11 DIAGNOSIS — F1910 Other psychoactive substance abuse, uncomplicated: Secondary | ICD-10-CM | POA: Insufficient documentation

## 2019-08-11 LAB — ACETAMINOPHEN LEVEL: Acetaminophen (Tylenol), Serum: <10 ug/mL — ABNORMAL LOW (ref 10–30)

## 2019-08-11 LAB — URINE DRUG SCREEN, QUALITATIVE (ARMC ONLY)
Amphetamines, Ur Screen: NOT DETECTED
Barbiturates, Ur Screen: NOT DETECTED
Benzodiazepine, Ur Scrn: POSITIVE — AB
Cannabinoid 50 Ng, Ur ~~LOC~~: POSITIVE — AB
Cocaine Metabolite,Ur ~~LOC~~: NOT DETECTED
MDMA (Ecstasy)Ur Screen: NOT DETECTED
Methadone Scn, Ur: NOT DETECTED
Opiate, Ur Screen: NOT DETECTED
Phencyclidine (PCP) Ur S: NOT DETECTED
Tricyclic, Ur Screen: NOT DETECTED

## 2019-08-11 LAB — CBC
HCT: 41.3 % (ref 39.0–52.0)
Hemoglobin: 14 g/dL (ref 13.0–17.0)
MCH: 30.7 pg (ref 26.0–34.0)
MCHC: 33.9 g/dL (ref 30.0–36.0)
MCV: 90.6 fL (ref 80.0–100.0)
Platelets: 224 10*3/uL (ref 150–400)
RBC: 4.56 MIL/uL (ref 4.22–5.81)
RDW: 12.6 % (ref 11.5–15.5)
WBC: 8.4 10*3/uL (ref 4.0–10.5)
nRBC: 0 % (ref 0.0–0.2)

## 2019-08-11 LAB — COMPREHENSIVE METABOLIC PANEL
ALT: 24 U/L (ref 0–44)
AST: 27 U/L (ref 15–41)
Albumin: 4 g/dL (ref 3.5–5.0)
Alkaline Phosphatase: 45 U/L (ref 38–126)
Anion gap: 8 (ref 5–15)
BUN: 12 mg/dL (ref 6–20)
CO2: 26 mmol/L (ref 22–32)
Calcium: 9 mg/dL (ref 8.9–10.3)
Chloride: 106 mmol/L (ref 98–111)
Creatinine, Ser: 0.99 mg/dL (ref 0.61–1.24)
GFR calc Af Amer: >60 mL/min (ref >60)
GFR calc non Af Amer: >60 mL/min (ref >60)
Glucose, Bld: 109 mg/dL — ABNORMAL HIGH (ref 70–99)
Potassium: 3.7 mmol/L (ref 3.5–5.1)
Sodium: 140 mmol/L (ref 135–145)
Total Bilirubin: 0.4 mg/dL (ref 0.3–1.2)
Total Protein: 7.1 g/dL (ref 6.5–8.1)

## 2019-08-11 LAB — SALICYLATE LEVEL: Salicylate Lvl: <7 mg/dL — ABNORMAL LOW (ref 7.0–30.0)

## 2019-08-11 LAB — ETHANOL: Alcohol, Ethyl (B): <10 mg/dL (ref <10)

## 2019-08-11 MED ORDER — NICOTINE 21 MG/24HR TD PT24
21.0000 mg | MEDICATED_PATCH | Freq: Once | TRANSDERMAL | Status: DC
  Administered 2019-08-11: 21 mg via TRANSDERMAL
  Filled 2019-08-11: qty 1

## 2019-08-11 NOTE — ED Provider Notes (Signed)
[facility] Emergency Department Provider Note       Time seen: ----------------------------------------- 4:25 PM on 08/11/2019 -----------------------------------------   I have reviewed the triage vital signs and the nursing notes.  HISTORY   Chief Complaint Psychiatric Evaluation    HPI Paul Booker is a 34 y.o. male with a history of Asperger's disorder, syncope who presents to the ED for a psychiatric evaluation.  Patient states he is having issues with alcoholism and asked [employer].  He is also homeless, states he was about to be in a bad place would call for help.  He denies any pain.  Past Medical History:  Diagnosis Date  . Asperger's disorder   . Syncope and collapse     There are no problems to display for this patient.   Past Surgical History:  Procedure Laterality Date  . adnoids    . adnoids removed      Allergies Patient has no known allergies.  Social History Social History   Tobacco Use  . Smoking status: Current Every Day Smoker    Packs/day: 1.00    Years: 11.00    Pack years: 11.00    Types: Cigarettes  . Smokeless tobacco: Never Used  Substance Use Topics  . Alcohol use: Yes    Comment: 1 beer tonight  . Drug use: Yes    Types: Marijuana, Cocaine, Methamphetamines    Comment: twice a week   Review of Systems Constitutional: Negative for fever. Cardiovascular: Negative for chest pain. Respiratory: Negative for shortness of breath. Gastrointestinal: Negative for abdominal pain, vomiting and diarrhea. Musculoskeletal: Negative for back pain. Skin: Negative for rash. Neurological: Negative for headaches, focal weakness or numbness. Psychiatric: Positive for alcohol abuse, homelessness, depression  All systems negative/normal/unremarkable except as stated in the HPI  ____________________________________________   PHYSICAL EXAM:  VITAL SIGNS: ED Triage Vitals  Enc Vitals Group     BP --      Pulse --       Resp --      Temp --      Temp src --      SpO2 --      Weight 08/11/19 1559 204 lb (92.5 kg)     Height 08/11/19 1559 6\' 2"  (1.88 m)     Head Circumference --      Peak Flow --      Pain Score 08/11/19 1558 0     Pain Loc --      Pain Edu? --      Excl. in GC? --     Constitutional: Alert and oriented.  Depressed and tearful, unkempt appearance Eyes: Conjunctivae are normal. Normal extraocular movements. ENT      Head: Normocephalic and atraumatic.      Nose: No congestion/rhinnorhea.      Mouth/Throat: Mucous membranes are moist.      Neck: No stridor. Cardiovascular: Normal rate, regular rhythm. No murmurs, rubs, or gallops. Respiratory: Normal respiratory effort without tachypnea nor retractions. Breath sounds are clear and equal bilaterally. No wheezes/rales/rhonchi. Gastrointestinal: Soft and nontender. Normal bowel sounds Musculoskeletal: Nontender with normal range of motion in extremities. No lower extremity tenderness nor edema. Neurologic: No gross focal neurologic deficits are appreciated.  Skin:  Skin is warm, dry and intact. No rash noted. Psychiatric: Depressed mood, tearful  ____________________________________________  ED COURSE:  As part of my medical decision making, I reviewed the following data within the electronic MEDICAL RECORD NUMBER History obtained from family if available, nursing notes, old  chart and ekg, as well as notes from prior ED visits. Patient presented for alcohol use disorder, homelessness and depression, we will assess with labs as indicated at this time.   Procedures  [name] was evaluated in Emergency Department on 08/11/2019 for the symptoms described in the history of present illness. He was evaluated in the context of the global COVID-19 pandemic, which necessitated consideration that the patient might be at risk for infection with the SARS-CoV-2 virus that causes COVID-19. Institutional protocols and algorithms that pertain to the  evaluation of patients at risk for COVID-19 are in a state of rapid change based on information released by regulatory bodies including the CDC and federal and state organizations. These policies and algorithms were followed during the patient's care in the ED.  ____________________________________________   LABS (pertinent positives/negatives)  Labs Reviewed  URINE DRUG SCREEN, QUALITATIVE ([place]) - Abnormal; Notable for the following components:      Result Value   Cannabinoid 50 Ng, Ur [place] POSITIVE (*)    Benzodiazepine, Ur [place] POSITIVE (*)    All other components within normal limits  CBC  COMPREHENSIVE METABOLIC PANEL  ETHANOL  SALICYLATE LEVEL  ACETAMINOPHEN LEVEL  ____________________________________________   DIFFERENTIAL DIAGNOSIS   Depression, alcoholism, as burgers, substance abuse  FINAL ASSESSMENT AND PLAN  Alcohol use disorder, depression   Plan: The patient had presented for help with alcohol and feeling depressed. Patient's labs were grossly unremarkable.  He appears medically clear for psychiatric evaluation and disposition.   Laurence Aly, MD    Note: This note was generated in part or whole with voice recognition software. Voice recognition is usually quite accurate but there are transcription errors that can and very often do occur. I apologize for any typographical errors that were not detected and corrected.     Earleen Newport, MD 08/11/19 1750

## 2019-08-11 NOTE — ED Triage Notes (Signed)
Pt comes with officer for psych eval. Pt states that he is having issues with his alcoholism and Asperger's. Pt is also homeless. Pt states that "I was about to be in a bad place so I called for help". Pt very labile but cooperative.
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
## 2019-08-11 NOTE — Consult Note (Signed)
[facility] Face-to-Face Psychiatry Consult   Reason for Consult:  Psych evalu Referring Physician:  Dr. Mayford Knife  Patient Identification: Paul Booker MRN:  [id number] Principal Diagnosis: <principal problem not specified> Diagnosis:  Active Problems:   Malingering   Total Time spent with patient: 1 hour  Subjective:   Paul Booker is a 34 y.o. male patient admitted with c/o I have no where to go. "I got kicked out of detox and I have no where to go."  Per TTS:  Paul Booker is an 34 y.o. male presenting to [facility] ED voluntarily due to being kicked out of rehab by [facility] in [place]. Per triage note patient reported that he is having issues with his alcoholism and Asperger's disorder. Patient reported in triage "I was about to be in a bad place so I called for help." During the assessment patient was alert and cooperative. Patient reported "I'm in severe emotional pain, I walked out of rehab last night." Patient reported that had been receiving treatment for alcohol at [facility] in [place] but got kicked out due to going into the wrong bathroom. Patient reported "I was in a shelter but they kicked me out of there because I guess I said something wrong."   HPI:   Paul Booker, 34 y.o., male patient seen face to face  by this provider; chart reviewed and consulted with Dr. Mayford Knife on 08/11/19.  On evaluation Paul Booker reports that's he was just thrown out of the detox facility and have no where to go.     During evaluation Paul Booker is lying in bed; he is alert/oriented x 4; tearful/cooperative; and mood congruent with affect.  Patient is speaking in a clear tone at moderate volume, and normal pace; with good eye contact.     Patient does not endorse suicidal tendencies but states he doesn't care if he lives or dies.  He is here for shows clear evidence of secondary gain of unmet needs for housing, that is representative of limited and often-  maladaptive coping skills and rather than an indicator of imminent risk of death.    Hospitalization should not be used as a substitute for social services, substance abuse treatment, and legal assistance for patients who make contingent suicide threats (Characteristics and six-month outcome of patients who use suicide threats to seek hospital admission.  (1966). Psychiatric Services, 47(8), [phone]. (DOI: 10.1176/ps.47.8.871).    This provider and Dr. Mayford Knife have discussed assessment and treatment recommendations with patient.  Further we see no evidence of severe psychosis, or cognitive impairment.  The patient has tested positive for benzos and cannabis during this admission however this does not prevent the patient from acting under their own choice and volition.    Patient will reassessed in the am and discharged.    Past Psychiatric History: Yes  Risk to Self: Suicidal Ideation: No Suicidal Intent: No Is patient at risk for suicide?: No Suicidal Plan?: No Access to Means: No What has been your use of drugs/alcohol within the last 12 months?: Alcohol use How many times?: 0 Triggers for Past Attempts: None known Intentional Self Injurious Behavior: None Risk to Others: Homicidal Ideation: No Thoughts of Harm to Others: No Current Homicidal Intent: No Current Homicidal Plan: No Access to Homicidal Means: No History of harm to others?: No Assessment of Violence: None Noted Does patient have access to weapons?: No Criminal Charges Pending?: Yes Describe Pending Criminal Charges: DWI Does patient have a court date: Yes Court Date: 01/02/20 Prior  Inpatient Therapy: Prior Inpatient Therapy: No Prior Outpatient Therapy: Prior Outpatient Therapy: Yes Prior Therapy Dates: Currently Prior Therapy Facilty/Provider(s): [facility] Reason for Treatment: Depression Does patient have an ACCT team?: No Does patient have Intensive In-House Services?  : No Does patient have  [facility] services? : No Does patient have P4CC services?: No  Past Medical History:  Past Medical History:  Diagnosis Date  . Asperger's disorder   . Syncope and collapse     Past Surgical History:  Procedure Laterality Date  . adnoids    . adnoids removed     Family History:  Family History  Problem Relation Age of Onset  . Heart disease Mother   . Hypertension Mother   . Diabetes Father   . Asthma Brother    Family Psychiatric  History: unknown Social History:  Social History   Substance and Sexual Activity  Alcohol Use Yes   Comment: 1 beer tonight     Social History   Substance and Sexual Activity  Drug Use Yes  . Types: Marijuana, Cocaine, Methamphetamines   Comment: twice a week    Social History   Socioeconomic History  . Marital status: Single    Spouse name: Not on file  . Number of children: Not on file  . Years of education: Not on file  . Highest education level: Not on file  Occupational History  . Not on file  Tobacco Use  . Smoking status: Current Every Day Smoker    Packs/day: 1.00    Years: 11.00    Pack years: 11.00    Types: Cigarettes  . Smokeless tobacco: Never Used  Substance and Sexual Activity  . Alcohol use: Yes    Comment: 1 beer tonight  . Drug use: Yes    Types: Marijuana, Cocaine, Methamphetamines    Comment: twice a week  . Sexual activity: Not on file  Other Topics Concern  . Not on file  Social History Narrative  . Not on file   Social Determinants of Health   Financial Resource Strain:   . Difficulty of Paying Living Expenses: Not on file  Food Insecurity:   . Worried About Programme researcher, broadcasting/film/videounning Out of Food in the Last Year: Not on file  . Ran Out of Food in the Last Year: Not on file  Transportation Needs:   . Lack of Transportation (Medical): Not on file  . Lack of Transportation (Non-Medical): Not on file  Physical Activity:   . Days of Exercise per Week: Not on file  . Minutes of Exercise per Session: Not on file   Stress:   . Feeling of Stress : Not on file  Social Connections:   . Frequency of Communication with Friends and Family: Not on file  . Frequency of Social Gatherings with Friends and Family: Not on file  . Attends Religious Services: Not on file  . Active Member of Clubs or Organizations: Not on file  . Attends BankerClub or Organization Meetings: Not on file  . Marital Status: Not on file   Additional Social History:    Allergies:  No Known Allergies  Labs:  Results for orders placed or performed during the hospital encounter of 08/11/19 (from the past 48 hour(s))  Urine Drug Screen, Qualitative     Status: Abnormal   Collection Time: 08/11/19  3:56 PM  Result Value Ref Range   Tricyclic, Ur Screen NONE DETECTED NONE DETECTED   Amphetamines, Ur Screen NONE DETECTED NONE DETECTED   MDMA (Ecstasy)Ur  Screen NONE DETECTED NONE DETECTED   Cocaine Metabolite,Ur [place] NONE DETECTED NONE DETECTED   Opiate, Ur Screen NONE DETECTED NONE DETECTED   Phencyclidine (PCP) Ur S NONE DETECTED NONE DETECTED   Cannabinoid 50 Ng, Ur [place] POSITIVE (A) NONE DETECTED   Barbiturates, Ur Screen NONE DETECTED NONE DETECTED   Benzodiazepine, Ur Scrn POSITIVE (A) NONE DETECTED   Methadone Scn, Ur NONE DETECTED NONE DETECTED    Comment: (NOTE) Tricyclics + metabolites, urine    Cutoff 1000 ng/mL Amphetamines + metabolites, urine  Cutoff 1000 ng/mL MDMA (Ecstasy), urine              Cutoff 500 ng/mL Cocaine Metabolite, urine          Cutoff 300 ng/mL Opiate + metabolites, urine        Cutoff 300 ng/mL Phencyclidine (PCP), urine         Cutoff 25 ng/mL Cannabinoid, urine                 Cutoff 50 ng/mL Barbiturates + metabolites, urine  Cutoff 200 ng/mL Benzodiazepine, urine              Cutoff 200 ng/mL Methadone, urine                   Cutoff 300 ng/mL The urine drug screen provides only a preliminary, unconfirmed analytical test result and should not be used for non-medical purposes. Clinical consideration  and professional judgment should be applied to any positive drug screen result due to possible interfering substances. A more specific alternate chemical method must be used in order to obtain a confirmed analytical result. Gas chromatography / mass spectrometry (GC/MS) is the preferred confirmat ory method. Performed at [facility], [address] [place], [place] [address]   Comprehensive metabolic panel     Status: Abnormal   Collection Time: 08/11/19  5:35 PM  Result Value Ref Range   Sodium 140 135 - 145 mmol/L   Potassium 3.7 3.5 - 5.1 mmol/L   Chloride 106 98 - 111 mmol/L   CO2 26 22 - 32 mmol/L   Glucose, Bld 109 (H) 70 - 99 mg/dL   BUN 12 6 - 20 mg/dL   Creatinine, Ser 0.99 0.61 - 1.24 mg/dL   Calcium 9.0 8.9 - 10.3 mg/dL   Total Protein 7.1 6.5 - 8.1 g/dL   Albumin 4.0 3.5 - 5.0 g/dL   AST 27 15 - 41 U/L   ALT 24 0 - 44 U/L   Alkaline Phosphatase 45 38 - 126 U/L   Total Bilirubin 0.4 0.3 - 1.2 mg/dL   GFR calc non Af Amer >60 >60 mL/min   GFR calc Af Amer >60 >60 mL/min   Anion gap 8 5 - 15    Comment: Performed at [facility], [address], [place], [place] [address]  Ethanol     Status: None   Collection Time: 08/11/19  5:35 PM  Result Value Ref Range   Alcohol, Ethyl (B) <10 <10 mg/dL    Comment: (NOTE) Lowest detectable limit for serum alcohol is 10 mg/dL. For medical purposes only. Performed at [facility], [address] [place], [place] [address]   Salicylate level     Status: Abnormal   Collection Time: 08/11/19  5:35 PM  Result Value Ref Range   Salicylate Lvl <0.6 (L) 7.0 - 30.0 mg/dL    Comment: Performed at [facility], [address], [place], [place] [address]  Acetaminophen level     Status: Abnormal   Collection Time: 08/11/19  5:35 PM  Result Value Ref Range   Acetaminophen (Tylenol), Serum <10 (L) 10 - 30 ug/mL    Comment: (NOTE) Therapeutic concentrations vary significantly. A range of 10-30  ug/mL  may be an effective concentration for many patients. However, some  are best treated at concentrations outside of this range. Acetaminophen concentrations >150 ug/mL at 4 hours after ingestion  and >50 ug/mL at 12 hours after ingestion are often associated with  toxic reactions. Performed at [facility], [address] [place], [place] [address]   cbc     Status: None   Collection Time: 08/11/19  5:35 PM  Result Value Ref Range   WBC 8.4 4.0 - 10.5 K/uL   RBC 4.56 4.22 - 5.81 MIL/uL   Hemoglobin 14.0 13.0 - 17.0 g/dL   HCT 17.4 94.4 - 96.7 %   MCV 90.6 80.0 - 100.0 fL   MCH 30.7 26.0 - 34.0 pg   MCHC 33.9 30.0 - 36.0 g/dL   RDW 59.1 63.8 - 46.6 %   Platelets 224 150 - 400 K/uL   nRBC 0.0 0.0 - 0.2 %    Comment: Performed at [facility], [address], [place], [place] [address]    Current Facility-Administered Medications  Medication Dose Route Frequency Provider Last Rate Last Admin  . nicotine (NICODERM CQ - dosed in mg/24 hours) patch 21 mg  21 mg Transdermal Once Emily Filbert, MD   21 mg at 08/11/19 2248   Current Outpatient Medications  Medication Sig Dispense Refill  . magic mouthwash w/lidocaine SOLN Take 5 mLs by mouth 4 (four) times daily. 240 mL 0  . omeprazole (PRILOSEC) 20 MG capsule Take 1 capsule (20 mg total) by mouth daily. 30 capsule 0  . traMADol (ULTRAM) 50 MG tablet Take 1 tablet (50 mg total) by mouth every 6 (six) hours as needed. 10 tablet 0    Musculoskeletal: Strength & Muscle Tone: within normal limits Gait & Station: normal Patient leans: N/A  Psychiatric Specialty Exam: Physical Exam  Nursing note and vitals reviewed. Constitutional: He is oriented to person, place, and time. He appears well-developed.  HENT:  Head: Normocephalic.  Eyes: Pupils are equal, round, and reactive to light.  Cardiovascular: Normal rate.  Respiratory: Effort normal.  Musculoskeletal:        General: Normal range of motion.      Cervical back: Normal range of motion.  Neurological: He is alert and oriented to person, place, and time.  Skin: Skin is warm and dry.  Psychiatric: His speech is normal. Judgment and thought content normal. His affect is angry. He is agitated. Cognition and memory are normal. He exhibits a depressed mood.    Review of Systems  Psychiatric/Behavioral: Positive for behavioral problems. Negative for hallucinations, self-injury and suicidal ideas.  All other systems reviewed and are negative.   Blood pressure 129/67, pulse 82, temperature 97.8 F (36.6 C), temperature source Oral, resp. rate 16, height 6\' 2"  (1.88 m), weight 92.5 kg, SpO2 97 %.Body mass index is 26.19 kg/m.  General Appearance: Casual  Eye Contact:  Fair  Speech:  Clear and Coherent  Volume:  Normal  Mood:  Irritable  Affect:  Depressed  Thought Process:  Coherent  Orientation:  Full (Time, Place, and Person)  Thought Content:  WDL  Suicidal Thoughts:  No  Homicidal Thoughts:  No  Memory:  Immediate;   Fair  Judgement:  Fair  Insight:  Fair  Psychomotor Activity:  Normal  Concentration:  Concentration: Fair  Recall:  Fair  Fund of Knowledge:  Good  Language:  Good  Akathisia:  NA  Handed:  Right  AIMS (if indicated):     Assets:  Communication Skills Social Support  ADL's:  Intact  Cognition:  WNL  Sleep:        Treatment Plan Summary: Plan reassess in the am and discharge if patient remains psychiatrically stable.  Disposition: No evidence of imminent risk to self or others at present.   Patient does not meet criteria for psychiatric inpatient admission.  Jearld Lesch, NP 08/11/2019 11:17 PM

## 2019-08-11 NOTE — ED Notes (Signed)
Psych at bedside.

## 2019-08-11 NOTE — ED Notes (Signed)
Pt given snack as requested. 

## 2019-08-12 ENCOUNTER — Emergency Department
Admission: EM | Admit: 2019-08-12 | Discharge: 2019-08-13 | Disposition: A | Discharge status: Home / Self Care | Payer: Medicaid Other | Attending: Emergency Medicine | Admitting: Emergency Medicine

## 2019-08-12 ENCOUNTER — Other Ambulatory Visit: Payer: Self-pay

## 2019-08-12 DIAGNOSIS — F4325 Adjustment disorder with mixed disturbance of emotions and conduct: Secondary | ICD-10-CM | POA: Diagnosis present

## 2019-08-12 DIAGNOSIS — Z765 Malingerer [conscious simulation]: Secondary | ICD-10-CM

## 2019-08-12 DIAGNOSIS — F1410 Cocaine abuse, uncomplicated: Secondary | ICD-10-CM | POA: Insufficient documentation

## 2019-08-12 DIAGNOSIS — F329 Major depressive disorder, single episode, unspecified: Secondary | ICD-10-CM | POA: Insufficient documentation

## 2019-08-12 DIAGNOSIS — F17210 Nicotine dependence, cigarettes, uncomplicated: Secondary | ICD-10-CM | POA: Insufficient documentation

## 2019-08-12 DIAGNOSIS — Z5900 Homelessness unspecified: Secondary | ICD-10-CM

## 2019-08-12 DIAGNOSIS — R45851 Suicidal ideations: Secondary | ICD-10-CM

## 2019-08-12 DIAGNOSIS — F1210 Cannabis abuse, uncomplicated: Secondary | ICD-10-CM | POA: Insufficient documentation

## 2019-08-12 NOTE — ED Notes (Signed)
Psych at bedside.

## 2019-08-12 NOTE — ED Notes (Addendum)
Pt states "I'm not sure why she's sending me home, I told her if I leave i'm going to jump off a building and break my legs." This RN offered to message MD. Pt states "well when I come back with two broken legs don't look at me like i'm dumb." Pt reiterates that he is homeless and has nowhere to go in the cold. Asher Muir, NP notified. Pt then gets into shower. Given clothes to change back into.

## 2019-08-12 NOTE — Discharge Instructions (Signed)
[employer]  Mental health clinic in [place], [place] COVID-19 info: [email or web address] Get online care: [email or web address] Address: [address] [place], [place], [place] [address] Hours:  Closed ? Opens 8:30AM Mon Phone: [phone]  Recovering From Addiction Addiction is a complex disease of the brain. It causes an uncontrollable (compulsive) need for a substance. You can be addicted to substances including alcohol, tobacco, illegal drugs, or prescription medicines such as painkillers. Addiction can change the way that your brain works. It affects memory, behavior, and how you make decisions. Without treatment, addiction can get worse. However, with treatment and lifestyle changes, you can recover from addiction. What types of treatment are available? The treatment program that is right for you will depend on many factors, including the type of addiction that you have. Treatment programs can be outpatient or inpatient. In an outpatient program, you live at home and go to work or school, but you also go to a clinic for treatment. With an inpatient program, you live and sleep at the program facility during treatment. Other treatment options include:  Medicine. ? Some addictions may be treated with prescription medicines. ? You may also need medicine to treat other mental health conditions such as anxiety or depression.  Counseling and behavior therapy. Therapy can help you learn new ways to respond to situations that are stressful or that tempt you to use the addictive substance.  Support groups. These include therapy groups and 12-step programs. These can help individuals and families during treatment and recovery. Examples of 12-step programs are Alcoholics Anonymous (AA) and Narcotics Anonymous (NA). How to manage lifestyle changes Managing stress Too much stress can lead to returning to the addiction (having a relapse). You need to find effective ways to manage your stress. Some techniques to cope  with stress include:  Meditation, yoga, or deep breathing.  Exercise. Create an exercise routine that you enjoy and that allows you to work off some energy.  Creating or listening to music.  Muscle relaxation exercises.  Medicines Some medicines may make you feel calmer and help you have fewer cravings. If your health care provider prescribes medicines, make sure you:  Avoid using alcohol and other substances that may prevent your medicines from working properly (may interact).  Talk with your pharmacist or health care provider about all medicines that you take, the possible problems (side effects) that they can cause, and which medicines are safe to take together.  Make it your goal to take part in all treatment decisions (shared decision-making). Ask about possible side effects of medicines that your health care provider recommends, and tell him or her how you feel about having those side effects. It is best if shared decision-making with your health care provider is part of your total treatment plan. Relationships Supportive relationships are very important in your recovery. When you are recovering from drug addiction, it will be important to avoid being around people who use drugs. For many people, this means developing new and different relationships. Some ways to do this include:  Developing trusting relationships with the people you meet in treatment or in AA or NA. These people share your desire to stop using substances (get sober) and to stay sober.  Getting a sponsor as a primary support person if you are attending a 12-step program.  Building relationships with people you meet through activities such as hobbies, volunteering, or exercising. How to recognize changes in your condition When recovering from an addiction, it is very common for a person to  relapse and start using the substance again. Contact your sponsor, therapist, or health care provider to seek additional help if  you experience the following:  Anxiety.  Excessive anger.  Isolating yourself from others.  Trouble sleeping.  Feeling depressed.  Loss of appetite.  Fantasies about using the substance. Where to find support Talking to others  You may be advised to see a family therapist along with members of your family. Family therapy can help you and your family understand what led you to addiction. Talk with your family about this approach.  Let your family members or friends know that they can help you through treatment. Support from loved ones will be important to help you maintain positive changes. Financial resources Be sure to check with your insurance carrier to find out what treatment options are covered by your plan. You may also be able to find financial assistance through not-for-profit organizations or with local government-based resources. If you are taking medicines, you may be able to get the generic form, which may be less expensive than brand-name medicine. Some makers of prescription medicines also offer help to patients who cannot afford the medicines that they need. Follow these instructions at home:  Take over-the-counter and prescription medicines only as told by your health care provider.  Stay in treatment until you complete the program. Take an active role in your treatment and your physical and emotional self-care. Develop a follow-up plan.  Keep all follow-up visits as told by your health care provider and counselor. This is important.  Eat a healthy diet, exercise regularly, and get enough sleep. Questions to ask your health care provider  If you are taking medicines: ? How long do I need to take medicine? ? Are there any long-term side effects of my medicine? ? Are there other options instead of taking medicine?  Would I benefit from therapy?  How often should I follow up with a health care provider? Contact a health care provider if:  You feel like you  might relapse.  You have stopped taking your medicine. Get help right away if:  You have serious thoughts about hurting yourself or others. If you ever feel like you may hurt yourself or others, or have thoughts about taking your own life, get help right away. You can go to your nearest emergency department or call:  Your local emergency services (911 in the [place]  A suicide crisis helpline, such as the [place] at 952-439-2198. This is open 24 hours a day. Summary  With treatment and lifestyle changes, it is possible to recover from an addiction to substances like alcohol, tobacco, illegal drugs, or prescription medicines such as painkillers.  Find effective ways to manage your stress to avoid a relapse. Some techniques to cope with stress include exercise, meditation, yoga, and deep breathing.  Let loved ones know that their support is important to help you recover.  If you have any signs that you may relapse, contact your 12-step sponsor, therapist, or health care provider to seek additional help. This information is not intended to replace advice given to you by your health care provider. Make sure you discuss any questions you have with your health care provider. Document Revised: 04/14/2019 Document Reviewed: 12/04/2016 Elsevier Patient Education  [employer].

## 2019-08-12 NOTE — ED Notes (Signed)
Pt calls for nurse crying stating "I don't feel safe going home." Pt says that he is homeless and feels like he will freeze to death if he leaves. This RN and pt discuss a homeless shelter but pt states "they have denied me because they are full". Pt denies any SI/HI.

## 2019-08-12 NOTE — ED Notes (Signed)
Pt. Transferred from Triage to room 20 after dressing out and screening for contraband. Report to include Situation, Background, Assessment and Recommendations from triage RN. Pt. Oriented to Quad including Q15 minute rounds as well as Psychologist, counselling for their protection. Patient is alert and oriented, warm and dry in no acute distress. Patient denies SI, HI, and AVH. Pt. Encouraged to let me know if needs arise.

## 2019-08-12 NOTE — ED Notes (Signed)
Hourly rounding reveals patient in room. No complaints, stable, in no acute distress. Q15 minute rounds and monitoring via Rover and Officer to continue.   

## 2019-08-12 NOTE — ED Notes (Signed)
Pt given meal tray.

## 2019-08-12 NOTE — ED Notes (Signed)
Pt asking this tech for the remote, pt provided with remote, pt states "I really just want to cover up the sound of my crying"

## 2019-08-12 NOTE — ED Notes (Signed)
Pt showered and changed into personal belongings. Belonging abgs 3/3 returned to patient. Pt confirms all belongings are with him. Pt signs paper copy of d/c and paperwork handed to patient.

## 2019-08-12 NOTE — ED Notes (Signed)
Snack and beverage given. 

## 2019-08-12 NOTE — ED Notes (Signed)
Pt crying in room and when asked why pt states "I don't know". This RN asks If he can do anything and pt says no. Pt does want to shower.

## 2019-08-12 NOTE — Consult Note (Signed)
[facility] Face-to-Face Psychiatry Consult   Reason for Consult:  Psych evaluation Referring Physician:  Dr. Larinda Buttery Patient Identification: Paul Booker MRN:  [id number] Principal Diagnosis: <principal problem not specified> Diagnosis:  Active Problems:   * No active hospital problems. *   Total Time spent with patient: 1 hour  Subjective:   Paul Booker is a 34 y.o. male patient admitted with "Im here because of my ongoing issues of sadness. I'm not in physical danger, I'm in social danger".  HPI:  HPI:   Paul Booker, 34 y.o., male patient seen face to face  by this provider; chart reviewed and consulted with Dr. Mayford Knife on 08/11/19.  On evaluation Paul Booker reports that's he is still sad and cries all the time, despite being discharged this am.     During evaluation Paul Booker is sitting up on the bed; he is alert/oriented x 4; tearful/cooperative; and mood congruent with affect.  Patient is speaking in a clear tone at moderate volume, and normal pace; with good eye contact.     Patient endorses suicidal tendencies but Clinical research associate believes that it is for secondary gain which is stay in [facility] hospital as patient is currently homeless.    Hospitalization should not be used as a substitute for social services, substance abuse treatment,  and legal assistance for patients who make contingent suicide threats (Characteristics and  six-month outcome of patients who use suicide threats to seek hospital admission.  (1966).  Psychiatric Services, 47(8), 2060811157. (DOI: 10.1176/ps.47.8.871).    This provider and Dr. Larinda Buttery have discussed assessment and treatment recommendations with patient.  Further we see no evidence of severe psychosis, or cognitive impairment.  Patient will reassessed in the am and discharged.     Past Psychiatric History: Yes  Risk to Self:  no Risk to Others:  no Prior Inpatient Therapy:  no Prior Outpatient Therapy:  yes  Past Medical History:  Past Medical History:   Diagnosis Date  . Asperger's disorder   . Syncope and collapse     Past Surgical History:  Procedure Laterality Date  . adnoids    . adnoids removed     Family History:  Family History  Problem Relation Age of Onset  . Heart disease Mother   . Hypertension Mother   . Diabetes Father   . Asthma Brother    Family Psychiatric  History: unknown Social History:  Social History   Substance and Sexual Activity  Alcohol Use Yes   Comment: 1 beer tonight     Social History   Substance and Sexual Activity  Drug Use Yes  . Types: Marijuana, Cocaine, Methamphetamines   Comment: twice a week    Social History   Socioeconomic History  . Marital status: Single    Spouse name: Not on file  . Number of children: Not on file  . Years of education: Not on file  . Highest education level: Not on file  Occupational History  . Not on file  Tobacco Use  . Smoking status: Current Every Day Smoker    Packs/day: 1.00    Years: 11.00    Pack years: 11.00    Types: Cigarettes  . Smokeless tobacco: Never Used  Substance and Sexual Activity  . Alcohol use: Yes    Comment: 1 beer tonight  . Drug use: Yes    Types: Marijuana, Cocaine, Methamphetamines    Comment: twice a week  . Sexual activity: Not on file  Other Topics Concern  . Not on file  Social History Narrative  . Not on file   Social Determinants of Health   Financial Resource Strain:   . Difficulty of Paying Living Expenses: Not on file  Food Insecurity:   . Worried About Programme researcher, broadcasting/film/video in the Last Year: Not on file  . Ran Out of Food in the Last Year: Not on file  Transportation Needs:   . Lack of Transportation (Medical): Not on file  . Lack of Transportation (Non-Medical): Not on file  Physical Activity:   . Days of Exercise per Week: Not on file  . Minutes of Exercise per Session: Not on file  Stress:   . Feeling of Stress : Not on file  Social Connections:   . Frequency of Communication with Friends  and Family: Not on file  . Frequency of Social Gatherings with Friends and Family: Not on file  . Attends Religious Services: Not on file  . Active Member of Clubs or Organizations: Not on file  . Attends Banker Meetings: Not on file  . Marital Status: Not on file   Additional Social History:    Allergies:  No Known Allergies  Labs:  Results for orders placed or performed during the hospital encounter of 08/11/19 (from the past 48 hour(s))  Urine Drug Screen, Qualitative     Status: Abnormal   Collection Time: 08/11/19  3:56 PM  Result Value Ref Range   Tricyclic, Ur Screen NONE DETECTED NONE DETECTED   Amphetamines, Ur Screen NONE DETECTED NONE DETECTED   MDMA (Ecstasy)Ur Screen NONE DETECTED NONE DETECTED   Cocaine Metabolite,Ur [place] NONE DETECTED NONE DETECTED   Opiate, Ur Screen NONE DETECTED NONE DETECTED   Phencyclidine (PCP) Ur S NONE DETECTED NONE DETECTED   Cannabinoid 50 Ng, Ur [place] POSITIVE (A) NONE DETECTED   Barbiturates, Ur Screen NONE DETECTED NONE DETECTED   Benzodiazepine, Ur Scrn POSITIVE (A) NONE DETECTED   Methadone Scn, Ur NONE DETECTED NONE DETECTED    Comment: (NOTE) Tricyclics + metabolites, urine    Cutoff 1000 ng/mL Amphetamines + metabolites, urine  Cutoff 1000 ng/mL MDMA (Ecstasy), urine              Cutoff 500 ng/mL Cocaine Metabolite, urine          Cutoff 300 ng/mL Opiate + metabolites, urine        Cutoff 300 ng/mL Phencyclidine (PCP), urine         Cutoff 25 ng/mL Cannabinoid, urine                 Cutoff 50 ng/mL Barbiturates + metabolites, urine  Cutoff 200 ng/mL Benzodiazepine, urine              Cutoff 200 ng/mL Methadone, urine                   Cutoff 300 ng/mL The urine drug screen provides only a preliminary, unconfirmed analytical test result and should not be used for non-medical purposes. Clinical consideration and professional judgment should be applied to any positive drug screen result due to possible interfering  substances. A more specific alternate chemical method must be used in order to obtain a confirmed analytical result. Gas chromatography / mass spectrometry (GC/MS) is the preferred confirmat ory method. Performed at [facility], [address], [place], [place] [address]   Comprehensive metabolic panel     Status: Abnormal   Collection Time: 08/11/19  5:35 PM  Result Value Ref Range   Sodium 140  135 - 145 mmol/L   Potassium 3.7 3.5 - 5.1 mmol/L   Chloride 106 98 - 111 mmol/L   CO2 26 22 - 32 mmol/L   Glucose, Bld 109 (H) 70 - 99 mg/dL   BUN 12 6 - 20 mg/dL   Creatinine, Ser 0.99 0.61 - 1.24 mg/dL   Calcium 9.0 8.9 - 10.3 mg/dL   Total Protein 7.1 6.5 - 8.1 g/dL   Albumin 4.0 3.5 - 5.0 g/dL   AST 27 15 - 41 U/L   ALT 24 0 - 44 U/L   Alkaline Phosphatase 45 38 - 126 U/L   Total Bilirubin 0.4 0.3 - 1.2 mg/dL   GFR calc non Af Amer >60 >60 mL/min   GFR calc Af Amer >60 >60 mL/min   Anion gap 8 5 - 15    Comment: Performed at [facility], [address], [place], [place] [address]  Ethanol     Status: None   Collection Time: 08/11/19  5:35 PM  Result Value Ref Range   Alcohol, Ethyl (B) <10 <10 mg/dL    Comment: (NOTE) Lowest detectable limit for serum alcohol is 10 mg/dL. For medical purposes only. Performed at [facility], [address] [place], [place] [address]   Salicylate level     Status: Abnormal   Collection Time: 08/11/19  5:35 PM  Result Value Ref Range   Salicylate Lvl <7.1 (L) 7.0 - 30.0 mg/dL    Comment: Performed at [facility], [address] [place], [place] [address]  Acetaminophen level     Status: Abnormal   Collection Time: 08/11/19  5:35 PM  Result Value Ref Range   Acetaminophen (Tylenol), Serum <10 (L) 10 - 30 ug/mL    Comment: (NOTE) Therapeutic concentrations vary significantly. A range of 10-30 ug/mL  may be an effective concentration for many patients. However, some  are best treated at  concentrations outside of this range. Acetaminophen concentrations >150 ug/mL at 4 hours after ingestion  and >50 ug/mL at 12 hours after ingestion are often associated with  toxic reactions. Performed at [facility], [address] [place], [place] [address]   cbc     Status: None   Collection Time: 08/11/19  5:35 PM  Result Value Ref Range   WBC 8.4 4.0 - 10.5 K/uL   RBC 4.56 4.22 - 5.81 MIL/uL   Hemoglobin 14.0 13.0 - 17.0 g/dL   HCT 41.3 39.0 - 52.0 %   MCV 90.6 80.0 - 100.0 fL   MCH 30.7 26.0 - 34.0 pg   MCHC 33.9 30.0 - 36.0 g/dL   RDW 12.6 11.5 - 15.5 %   Platelets 224 150 - 400 K/uL   nRBC 0.0 0.0 - 0.2 %    Comment: Performed at [facility], [address] [place], [place] [address]    No current facility-administered medications for this encounter.   No current outpatient medications on file.    Musculoskeletal: Strength & Muscle Tone: within normal limits Gait & Station: normal Patient leans: N/A  Psychiatric Specialty Exam: Physical Exam  Nursing note and vitals reviewed. Constitutional: He is oriented to person, place, and time. He appears well-developed.  HENT:  Head: Normocephalic.  Eyes: Pupils are equal, round, and reactive to light.  Respiratory: Effort normal.  GI: Soft.  Musculoskeletal:        General: Normal range of motion.     Cervical back: Normal range of motion.  Neurological: He is alert  and oriented to person, place, and time.  Skin: Skin is warm and dry.  Psychiatric: His speech is normal. Judgment and thought content normal. His affect is labile and inappropriate. He is agitated and aggressive. Cognition and memory are normal.    Review of Systems  Psychiatric/Behavioral: Positive for behavioral problems. Negative for hallucinations and self-injury.  All other systems reviewed and are negative.   Blood pressure 132/83, pulse 83, temperature 98.1 F (36.7 C), temperature source Oral, resp. rate 16, height 6'  2" (1.88 m), weight 92.5 kg, SpO2 98 %.Body mass index is 26.19 kg/m.  General Appearance: Casual  Eye Contact:  Good  Speech:  Clear and Coherent  Volume:  Normal  Mood:  Depressed  Affect:  Congruent  Thought Process:  Coherent  Orientation:  Full (Time, Place, and Person)  Thought Content:  WDL  Suicidal Thoughts:  Yes.  without intent/plan  Homicidal Thoughts:  No  Memory:  Recent;   Fair  Judgement:  Fair  Insight:  Fair  Psychomotor Activity:  Normal  Concentration:  Concentration: Fair  Recall:  Good  Fund of Knowledge:  Good  Language:  Good  Akathisia:  NA  Handed:  Right  AIMS (if indicated):     Assets:  Communication Skills Desire for Improvement Physical Health  ADL's:  Intact  Cognition:  WNL  Sleep:        Treatment Plan Summary: Overnite observation. reassess and D/C in the am  Disposition: No evidence of imminent risk to self or others at present.   Patient does not meet criteria for psychiatric inpatient admission.  Jearld Lesch, NP 08/12/2019 11:13 PM

## 2019-08-12 NOTE — Consult Note (Addendum)
[facility] Psych ED Discharge  08/12/2019 10:28 AM Paul Booker  MRN:  [id number] Principal Problem: Adjustment disorder with mixed disturbance of emotions and conduct Discharge Diagnoses: Principal Problem:   Adjustment disorder with mixed disturbance of emotions and conduct Active Problems:   Malingering  Subjective: "I'm ok."  Patient reassessed.  Continues to have no suicidal/homicidal ideations, hallucinations, or withdrawal symptoms.  Reports he "mouthed off" to the shelter staff and did not get a bed.  Main issue is being homeless and alcohol abuse.  Resources provided for shelters and a bus pass to [place] for their shelters.  Stable psychiatrically.  Total Time spent with patient: 30 minutes  Past Psychiatric History: alcohol abuse  Past Medical History:  Past Medical History:  Diagnosis Date  . Asperger's disorder   . Syncope and collapse     Past Surgical History:  Procedure Laterality Date  . adnoids    . adnoids removed     Family History:  Family History  Problem Relation Age of Onset  . Heart disease Mother   . Hypertension Mother   . Diabetes Father   . Asthma Brother    Family Psychiatric  History: None Social History:  Social History   Substance and Sexual Activity  Alcohol Use Yes   Comment: 1 beer tonight     Social History   Substance and Sexual Activity  Drug Use Yes  . Types: Marijuana, Cocaine, Methamphetamines   Comment: twice a week    Social History   Socioeconomic History  . Marital status: Single    Spouse name: Not on file  . Number of children: Not on file  . Years of education: Not on file  . Highest education level: Not on file  Occupational History  . Not on file  Tobacco Use  . Smoking status: Current Every Day Smoker    Packs/day: 1.00    Years: 11.00    Pack years: 11.00    Types: Cigarettes  . Smokeless tobacco: Never Used  Substance and Sexual Activity  . Alcohol use: Yes    Comment: 1 beer tonight  . Drug use:  Yes    Types: Marijuana, Cocaine, Methamphetamines    Comment: twice a week  . Sexual activity: Not on file  Other Topics Concern  . Not on file  Social History Narrative  . Not on file   Social Determinants of Health   Financial Resource Strain:   . Difficulty of Paying Living Expenses: Not on file  Food Insecurity:   . Worried About Programme researcher, broadcasting/film/video in the Last Year: Not on file  . Ran Out of Food in the Last Year: Not on file  Transportation Needs:   . Lack of Transportation (Medical): Not on file  . Lack of Transportation (Non-Medical): Not on file  Physical Activity:   . Days of Exercise per Week: Not on file  . Minutes of Exercise per Session: Not on file  Stress:   . Feeling of Stress : Not on file  Social Connections:   . Frequency of Communication with Friends and Family: Not on file  . Frequency of Social Gatherings with Friends and Family: Not on file  . Attends Religious Services: Not on file  . Active Member of Clubs or Organizations: Not on file  . Attends Banker Meetings: Not on file  . Marital Status: Not on file    Has this patient used any form of tobacco in the last 30 days? (Cigarettes, Smokeless  Tobacco, Cigars, and/or Pipes) A prescription for an FDA-approved tobacco cessation medication was offered at discharge and the patient refused  Current Medications: Current Facility-Administered Medications  Medication Dose Route Frequency Provider Last Rate Last Admin  . nicotine (NICODERM CQ - dosed in mg/24 hours) patch 21 mg  21 mg Transdermal Once Emily Filbert, MD   21 mg at 08/11/19 2248   Current Outpatient Medications  Medication Sig Dispense Refill  . magic mouthwash w/lidocaine SOLN Take 5 mLs by mouth 4 (four) times daily. 240 mL 0  . omeprazole (PRILOSEC) 20 MG capsule Take 1 capsule (20 mg total) by mouth daily. 30 capsule 0  . traMADol (ULTRAM) 50 MG tablet Take 1 tablet (50 mg total) by mouth every 6 (six) hours as  needed. 10 tablet 0   PTA Medications: (Not in a hospital admission)   Musculoskeletal: Strength & Muscle Tone: within normal limits Gait & Station: normal Patient leans: N/A  Psychiatric Specialty Exam: Physical Exam Vitals and nursing note reviewed.  Constitutional:      Appearance: Normal appearance.  HENT:     Head: Normocephalic.     Nose: Nose normal.  Pulmonary:     Effort: Pulmonary effort is normal.  Musculoskeletal:        General: Normal range of motion.  Neurological:     General: No focal deficit present.     Mental Status: He is alert and oriented to person, place, and time.  Psychiatric:        Attention and Perception: Attention and perception normal.        Mood and Affect: Affect normal. Mood is anxious.        Speech: Speech normal.        Behavior: Behavior normal. Behavior is cooperative.        Thought Content: Thought content normal.        Cognition and Memory: Cognition and memory normal.        Judgment: Judgment normal.     Review of Systems  Psychiatric/Behavioral: The patient is nervous/anxious.   All other systems reviewed and are negative.   Blood pressure 131/76, pulse 87, temperature (!) 97.5 F (36.4 C), temperature source Oral, resp. rate 18, height 6\' 2"  (1.88 m), weight 92.5 kg, SpO2 99 %.Body mass index is 26.19 kg/m.  General Appearance: Casual  Eye Contact:  Good  Speech:  Normal Rate  Volume:  Normal  Mood:  Anxious  Affect:  Congruent  Thought Process:  Coherent and Descriptions of Associations: Intact  Orientation:  Full (Time, Place, and Person)  Thought Content:  WDL and Logical  Suicidal Thoughts:  No  Homicidal Thoughts:  No  Memory:  Immediate;   Good Recent;   Good Remote;   Good  Judgement:  Fair  Insight:  Fair  Psychomotor Activity:  Decreased  Concentration:  Concentration: Good and Attention Span: Good  Recall:  Good  Fund of Knowledge:  Good  Language:  Good  Akathisia:  No  Handed:  Right  AIMS  (if indicated):     Assets:  Leisure Time Physical Health Resilience  ADL's:  Intact  Cognition:  WNL  Sleep:        Demographic Factors:  Male and Caucasian  Loss Factors: Financial problems/change in socioeconomic status  Historical Factors: NA  Risk Reduction Factors:   Sense of responsibility to family and Positive therapeutic relationship  Continued Clinical Symptoms:  Anxiety, mild  Cognitive Features That Contribute To Risk:  None  Suicide Risk:  Minimal: No identifiable suicidal ideation.  Patients presenting with no risk factors but with morbid ruminations; may be classified as minimal risk based on the severity of the depressive symptoms   Plan Of Care/Follow-up recommendations:  Adjustment disorder with mixed disturbance of emotions and conduct: -Shelter resources provided along with a bus pass to [place] -Follow up with [facility] in [place] or [facility] in [place] Activity:  as tolerated Diet:  heart healthy diet  Disposition: discharge home Waylan Boga, NP 08/12/2019, 10:28 AM   Patient discussed and plan agreed upon as outlined above.

## 2019-08-12 NOTE — ED Notes (Addendum)
Patient came in with 1.96 coin and $ 17.00 dollar cash. Also two multi tools, one fork, one pen and one clipper all kept locked in the safe. Key #21 locked in the pysix.

## 2019-08-12 NOTE — ED Notes (Addendum)
Pt placed following items in two larger personal belongings bags: black shoes, green shirt, blue jeans, green sweatpants, white mask, white coat, grey jacket, baseball cap, beanie, brown belt. In one smaller "biohazard bag" this nt placed two cell-phones, wallet, cigarettes, keys, phone charger, and car Advertising account planner. In second "biohazard bag" the following was placed: metal fork, pen, red lighter, fingernail clippers, multi-tools, and a rock. In third "biohazard bag" there is $17 in cash and $1.96 in coins.

## 2019-08-12 NOTE — ED Triage Notes (Signed)
Pt back to hospital IVC from [facility]. Pt was d/c this morning and now back for SI. Pt states he told the psychiatrist here he [place]. Pt laughing and now states it is a bad plan.

## 2019-08-12 NOTE — ED Provider Notes (Signed)
[facility] Emergency Department Provider Note   ____________________________________________   First MD Initiated Contact with Patient 08/12/19 2101     (approximate)  I have reviewed the triage vital signs and the nursing notes.   HISTORY  Chief Complaint Behavior Problem    HPI Paul Booker is a 34 y.o. male with past medical history of Asperger's disorder and mood disorder who presents to the ED complaining of suicidal ideation.  Patient reports "I never should have left rehab 3 days ago".  He states "I hurt in my soul" and is requesting help with his thoughts of suicide as well as alcohol abuse.  He states he has been [place] [address] [place].  He also reports his last drink of alcohol was 3 days ago and currently denies any alcohol withdrawal symptoms.  He was initially seen at [facility], where IVC was initiated and he was sent to the ED for further evaluation.        Past Medical History:  Diagnosis Date  . Asperger's disorder   . Syncope and collapse     Patient Active Problem List   Diagnosis Date Noted  . 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 Malingering 08/11/2019    Past Surgical History:  Procedure Laterality Date  . adnoids    . adnoids removed      Prior to Admission medications   Not on File    Allergies Patient has no known allergies.  Family History  Problem Relation Age of Onset  . Heart disease Mother   . Hypertension Mother   . Diabetes Father   . Asthma Brother     Social History Social History   Tobacco Use  . Smoking status: Current Every Day Smoker    Packs/day: 1.00    Years: 11.00    Pack years: 11.00    Types: Cigarettes  . Smokeless tobacco: Never Used  Substance Use Topics  . Alcohol use: Yes    Comment: 1 beer tonight  . Drug use: Yes    Types: Marijuana, Cocaine, Methamphetamines    Comment: twice a week    Review of  Systems  Constitutional: No fever/chills Eyes: No visual changes. ENT: No sore throat. Cardiovascular: Denies chest pain. Respiratory: Denies shortness of breath. Gastrointestinal: No abdominal pain.  No nausea, no vomiting.  No diarrhea.  No constipation. Genitourinary: Negative for dysuria. Musculoskeletal: Negative for back pain. Skin: Negative for rash. Neurological: Negative for headaches, focal weakness or numbness.  Positive for suicidal ideation.  ____________________________________________   PHYSICAL EXAM:  VITAL SIGNS: ED Triage Vitals  Enc Vitals Group     BP 08/12/19 2021 132/83     Pulse Rate 08/12/19 2021 83     Resp 08/12/19 2021 16     Temp 08/12/19 2021 98.1 F (36.7 C)     Temp Source 08/12/19 2021 Oral     SpO2 08/12/19 2021 98 %     Weight 08/12/19 2026 204 lb (92.5 kg)     Height 08/12/19 2026 6\' 2"  (1.88 m)     Head Circumference --      Peak Flow --      Pain Score 08/12/19 2026 0     Pain Loc --      Pain Edu? --      Excl. in GC? --     Constitutional: Alert and oriented. Eyes: Conjunctivae are normal. Head: Atraumatic. Nose: No congestion/rhinnorhea. Mouth/Throat: Mucous membranes  are moist. Neck: Normal ROM Cardiovascular: Normal rate, regular rhythm. Grossly normal heart sounds. Respiratory: Normal respiratory effort.  No retractions. Lungs CTAB. Gastrointestinal: Soft and nontender. No distention. Genitourinary: deferred Musculoskeletal: No lower extremity tenderness nor edema. Neurologic:  Normal speech and language. No gross focal neurologic deficits are appreciated. Skin:  Skin is warm, dry and intact. No rash noted. Psychiatric: Mood and affect are normal. Speech and behavior are normal.  ____________________________________________   LABS (all labs ordered are listed, but only abnormal results are displayed)  Labs Reviewed  SARS CORONAVIRUS 2 (TAT 6-24 HRS)  BASIC METABOLIC PANEL  CBC WITH DIFFERENTIAL/PLATELET  ETHANOL   URINE DRUG SCREEN, QUALITATIVE ([facility] ONLY)     PROCEDURES  Procedure(s) performed (including Critical Care):  Procedures   ____________________________________________   INITIAL IMPRESSION / ASSESSMENT AND PLAN / ED COURSE       34 year old male presents to the ED requesting help with suicidal ideation, reports a plan of jumping off a bridge.  IVC was initiated prior to arrival at [facility], patient is calm and cooperative currently.  He denies any medical complaints and there is no evidence of alcohol withdrawal at this time.  He is medically cleared for psychiatric evaluation.  Patient evaluated by psychiatry and there is some concern that he is malingering for a place to stay.  Psychiatry will plan on observing patient overnight and reevaluating in the morning for potential discharge.      ____________________________________________   FINAL CLINICAL IMPRESSION(S) / ED DIAGNOSES  Final diagnoses:  Suicidal ideation  Homelessness     ED Discharge Orders    None       Note:  This document was prepared using Dragon voice recognition software and may include unintentional dictation errors.   Blake Divine, MD 08/13/19 0020

## 2019-08-13 ENCOUNTER — Other Ambulatory Visit: Payer: Self-pay

## 2019-08-13 ENCOUNTER — Emergency Department
Admission: EM | Admit: 2019-08-13 | Discharge: 2019-08-14 | Disposition: A | Discharge status: Home / Self Care | Payer: Medicaid Other | Attending: Emergency Medicine | Admitting: Emergency Medicine

## 2019-08-13 DIAGNOSIS — F17210 Nicotine dependence, cigarettes, uncomplicated: Secondary | ICD-10-CM | POA: Insufficient documentation

## 2019-08-13 DIAGNOSIS — Y92828 Other wilderness area as the place of occurrence of the external cause: Secondary | ICD-10-CM | POA: Insufficient documentation

## 2019-08-13 DIAGNOSIS — F1210 Cannabis abuse, uncomplicated: Secondary | ICD-10-CM | POA: Insufficient documentation

## 2019-08-13 DIAGNOSIS — F4325 Adjustment disorder with mixed disturbance of emotions and conduct: Secondary | ICD-10-CM

## 2019-08-13 DIAGNOSIS — X58XXXA Exposure to other specified factors, initial encounter: Secondary | ICD-10-CM | POA: Insufficient documentation

## 2019-08-13 DIAGNOSIS — Y9302 Activity, running: Secondary | ICD-10-CM | POA: Insufficient documentation

## 2019-08-13 DIAGNOSIS — Z765 Malingerer [conscious simulation]: Secondary | ICD-10-CM | POA: Insufficient documentation

## 2019-08-13 DIAGNOSIS — Y998 Other external cause status: Secondary | ICD-10-CM | POA: Insufficient documentation

## 2019-08-13 DIAGNOSIS — R45851 Suicidal ideations: Secondary | ICD-10-CM | POA: Insufficient documentation

## 2019-08-13 DIAGNOSIS — F1510 Other stimulant abuse, uncomplicated: Secondary | ICD-10-CM | POA: Insufficient documentation

## 2019-08-13 DIAGNOSIS — F1410 Cocaine abuse, uncomplicated: Secondary | ICD-10-CM | POA: Insufficient documentation

## 2019-08-13 DIAGNOSIS — S60450A Superficial foreign body of right index finger, initial encounter: Secondary | ICD-10-CM | POA: Insufficient documentation

## 2019-08-13 MED ORDER — LIDOCAINE HCL (PF) 1 % IJ SOLN
5.0000 mL | Freq: Once | INTRAMUSCULAR | Status: DC
  Filled 2019-08-13: qty 5

## 2019-08-13 NOTE — ED Notes (Signed)
Hourly rounding reveals patient in room. No complaints, stable, in no acute distress. Q15 minute rounds and monitoring via Rover and Officer to continue.   

## 2019-08-13 NOTE — Social Work (Signed)
TOC CM/SW received request for bus pass.  Bus pass given to ED secretary.   Larwance Rote, MSW, LCSW  [phone] 8am-5:30pm

## 2019-08-13 NOTE — Consult Note (Signed)
[facility] Psych ED Discharge  08/13/2019 2:30 PM Paul Booker  MRN:  [id number] Principal Problem: Adjustment disorder with mixed disturbance of emotions and conduct Discharge Diagnoses: Principal Problem:   Adjustment disorder with mixed disturbance of emotions and conduct Active Problems:   Malingering  Subjective: "I'm ok but need help with finding a place to go."  Patient reassessed.  Continues to have no suicidal/homicidal ideations, hallucinations, or withdrawal symptoms.  He was cleared by the PMH NP from the previous shift.  Patient initially offered a bus ticket to [place] where he could stay at the shelter for an additional 120 days as he reported he had used 120 days at the [facility].  He did not like this plan and threatened to go to [place] [address], the same threat he made yesterday after discharge.  Discussed the case with Dr. Mallie Darting, attending psychiatrist, and he said to give the option for him to go to the [facility] or [place] rescue [facility] for be discharged.  Patient's RN asked him and he wanted to go.  This provider called and beds were available and he can go for his admission intake at any time at the [facility] rescue mission on [address] in [place] for a 1 year program as long as he was willing to work.  Provider went and spoke to the patient he was concerned about going for a year as he has court in [place] in June.  This provider discussed the stabilization importance prior to going to court and that he should be able to leave for this appointment.  He needed to get his clothes and belongings from a church in town on his RN and this NP called on an attempt to retrieve these.  Social worker also involved offered 3 new outfits to the patient but he reports he only needed 10 issues.  This provider went with the social worker and got a new pair tissues, pair socks, and a sweatshirt for the patient.  He had made the decision that he wanted to go to retrieve his  belongings and then take the bus to the [facility] rescue mission.  Bus pass retrieved from the social work for a bus pass to [facility].  Patient agreeable to the plan with no suicidal/homicidal ideations, psychiatrically stable to proceed to admission.  Total Time spent with patient: 30 minutes  Past Psychiatric History: alcohol abuse  Past Medical History:  Past Medical History:  Diagnosis Date  . Asperger's disorder   . Syncope and collapse     Past Surgical History:  Procedure Laterality Date  . adnoids    . adnoids removed     Family History:  Family History  Problem Relation Age of Onset  . Heart disease Mother   . Hypertension Mother   . Diabetes Father   . Asthma Brother    Family Psychiatric  History: None Social History:  Social History   Substance and Sexual Activity  Alcohol Use Yes   Comment: 1 beer tonight     Social History   Substance and Sexual Activity  Drug Use Yes  . Types: Marijuana, Cocaine, Methamphetamines   Comment: twice a week    Social History   Socioeconomic History  . Marital status: Single    Spouse name: Not on file  . Number of children: Not on file  . Years of education: Not on file  . Highest education level: Not on file  Occupational History  . Not on  file  Tobacco Use  . Smoking status: Current Every Day Smoker    Packs/day: 1.00    Years: 11.00    Pack years: 11.00    Types: Cigarettes  . Smokeless tobacco: Never Used  Substance and Sexual Activity  . Alcohol use: Yes    Comment: 1 beer tonight  . Drug use: Yes    Types: Marijuana, Cocaine, Methamphetamines    Comment: twice a week  . Sexual activity: Not on file  Other Topics Concern  . Not on file  Social History Narrative  . Not on file   Social Determinants of Health   Financial Resource Strain:   . Difficulty of Paying Living Expenses: Not on file  Food Insecurity:   . Worried About Programme researcher, broadcasting/film/video in the Last Year: Not on file  . Ran Out of Food in the  Last Year: Not on file  Transportation Needs:   . Lack of Transportation (Medical): Not on file  . Lack of Transportation (Non-Medical): Not on file  Physical Activity:   . Days of Exercise per Week: Not on file  . Minutes of Exercise per Session: Not on file  Stress:   . Feeling of Stress : Not on file  Social Connections:   . Frequency of Communication with Friends and Family: Not on file  . Frequency of Social Gatherings with Friends and Family: Not on file  . Attends Religious Services: Not on file  . Active Member of Clubs or Organizations: Not on file  . Attends Banker Meetings: Not on file  . Marital Status: Not on file    Has this patient used any form of tobacco in the last 30 days? (Cigarettes, Smokeless Tobacco, Cigars, and/or Pipes) A prescription for an FDA-approved tobacco cessation medication was offered at discharge and the patient refused  Current Medications: No current facility-administered medications for this encounter.   No current outpatient medications on file.   PTA Medications: (Not in a hospital admission)   Musculoskeletal: Strength & Muscle Tone: within normal limits Gait & Station: normal Patient leans: N/A  Psychiatric Specialty Exam: Physical Exam Vitals and nursing note reviewed.  Constitutional:      Appearance: Normal appearance.  HENT:     Head: Normocephalic.     Nose: Nose normal.  Pulmonary:     Effort: Pulmonary effort is normal.  Musculoskeletal:        General: Normal range of motion.  Neurological:     General: No focal deficit present.     Mental Status: He is alert and oriented to person, place, and time.  Psychiatric:        Attention and Perception: Attention and perception normal.        Mood and Affect: Affect normal. Mood is anxious.        Speech: Speech normal.        Behavior: Behavior normal. Behavior is cooperative.        Thought Content: Thought content normal.        Cognition and Memory:  Cognition and memory normal.        Judgment: Judgment normal.     Review of Systems  Psychiatric/Behavioral: The patient is nervous/anxious.   All other systems reviewed and are negative.   Blood pressure 136/79, pulse 95, temperature 98.6 F (37 C), temperature source Oral, resp. rate 18, height 6\' 2"  (1.88 m), weight 92.5 kg, SpO2 99 %.Body mass index is 26.19 kg/m.  General Appearance: Casual  Eye Contact:  Good  Speech:  Normal Rate  Volume:  Normal  Mood:  Anxious  Affect:  Congruent  Thought Process:  Coherent and Descriptions of Associations: Intact  Orientation:  Full (Time, Place, and Person)  Thought Content:  WDL and Logical  Suicidal Thoughts:  No  Homicidal Thoughts:  No  Memory:  Immediate;   Good Recent;   Good Remote;   Good  Judgement:  Fair  Insight:  Fair  Psychomotor Activity:  Decreased  Concentration:  Concentration: Good and Attention Span: Good  Recall:  Good  Fund of Knowledge:  Good  Language:  Good  Akathisia:  No  Handed:  Right  AIMS (if indicated):     Assets:  Leisure Time Physical Health Resilience  ADL's:  Intact  Cognition:  WNL  Sleep:        Demographic Factors:  Male and Caucasian  Loss Factors: Financial problems/change in socioeconomic status  Historical Factors: NA  Risk Reduction Factors:   Sense of responsibility to family and Positive therapeutic relationship  Continued Clinical Symptoms:  Anxiety, mild  Cognitive Features That Contribute To Risk:  None    Suicide Risk:  Minimal: No identifiable suicidal ideation.  Patients presenting with no risk factors but with morbid ruminations; may be classified as minimal risk based on the severity of the depressive symptoms  Follow-up Information    [facility].   Specialty: Emergency Medicine Why: If symptoms worsen Contact information: [address] [id number] [address] [place]  [address] [phone]       Schedule an appointment as soon as possible for a visit  with [facility]   Contact information: [address] [place] [address] [phone]          Plan Of Care/Follow-up recommendations:  Adjustment disorder with mixed disturbance of emotions and conduct: -[place] [facility] contacted with bed availability and address provided to the patient -Bus pass provided by the social work for the patient to go to the mission Activity:  as tolerated Diet:  heart healthy diet  Disposition: discharge home Nanine Means, NP 08/13/2019, 2:30 PM   Patient discussed and plan agreed upon as outlined above.

## 2019-08-13 NOTE — ED Notes (Signed)
Pt to triage 1 with [employer].  Pts belongings, which include 2 hats, coat, zip up jacket, hoodie, tshirt jeans, belt, long john bottoms, 2 pr shoes, and in a manila envelope given to me by [employer] officer includes pair of socks, 2 masks, fork, utility knife, 2 cell phones both with cracked screens, cigs, charger,nail clippers, lighter, black wallet with 8.00 in cash,  1-$5 bill and 3-$1 bills verified by patient and [employer].  Pt has his glasses on his possession.  Pt has a total of 3 bags.  Pt ambulated to [facility], bags placed at nurses station.  Pt dressed in burgundy pants and blue shirt with [employer] and myself.

## 2019-08-13 NOTE — ED Notes (Signed)
Pt given new shoes, socks, and a new sweatshirt per social work to take with him to the shelter.

## 2019-08-13 NOTE — ED Notes (Signed)
Pt. Transferred from Triage to room [facility] after dressing out and screening for contraband. Report to include Situation, Background, Assessment and Recommendations from triage RN. Pt. Oriented to Quad including Q15 minute rounds as well as Psychologist, counselling for their protection. Patient is alert and oriented, warm and dry in no acute distress. Patient reported SI with a plan to jump of the bridge. Denied HI, and AVH. Pt. Encouraged to let me know if needs arise.

## 2019-08-13 NOTE — ED Notes (Signed)
Social work at bedside with Asher Muir, NP and this RN to explain options. Pt is able to get a bed at [facility] shelter with one year program. Pt worried about clothes. Pt crying and isn't sure if he wants to go. Social work arranging transportation there. Pt still unsure about going. Has the option and will be given all the resources.

## 2019-08-13 NOTE — ED Notes (Signed)
When asked about SI, pt states that he doesn't feel any right now "probably because I am safe right now." When asked about how pt slept, he states "I didn't have bad dreams last night" and "I don't think I really slept much".

## 2019-08-13 NOTE — ED Notes (Signed)
Pt given meal tray.

## 2019-08-13 NOTE — ED Provider Notes (Signed)
[facility] Emergency Department Provider Note ____________________________________________   First MD Initiated Contact with Patient 08/13/19 2158     (approximate)  I have reviewed the triage vital signs and the nursing notes.   HISTORY  Chief Complaint Medical Clearance    HPI Paul Booker is a 34 y.o. male with PMH as noted below who presents for suicidal ideation.  He states he feels like he wants to jump off a bridge.  In addition he states that he feels "insecure, emotional, and neurotic.  He also reports a splinter that occurred when he ran through a briar patch a few days ago.  It is over the DIP knuckle on his right index finger.  He states he has tried to get it out on his own but has been unable to.  He denies any other acute medical complaints.  Past Medical History:  Diagnosis Date  . Asperger's disorder   . Syncope and collapse     Patient Active Problem List   Diagnosis Date Noted  . 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 Malingering 08/11/2019    Past Surgical History:  Procedure Laterality Date  . adnoids    . adnoids removed      Prior to Admission medications   Not on File    Allergies Concerta [methylphenidate]  Family History  Problem Relation Age of Onset  . Heart disease Mother   . Hypertension Mother   . Diabetes Father   . Asthma Brother     Social History Social History   Tobacco Use  . Smoking status: Current Every Day Smoker    Packs/day: 1.00    Years: 11.00    Pack years: 11.00    Types: Cigarettes  . Smokeless tobacco: Never Used  Substance Use Topics  . Alcohol use: Yes    Comment: 1 beer tonight  . Drug use: Yes    Types: Marijuana, Cocaine, Methamphetamines    Comment: twice a week    Review of Systems  Constitutional: No fever. Eyes: No redness. ENT: No sore throat. Cardiovascular: Denies chest pain. Respiratory: Denies shortness of  breath. Gastrointestinal: No vomiting. Genitourinary: Negative for dysuria.  Musculoskeletal: Negative for back pain. Skin: Negative for rash. Neurological: Negative for headache.   ____________________________________________   PHYSICAL EXAM:  VITAL SIGNS: ED Triage Vitals  Enc Vitals Group     BP 08/13/19 2128 135/72     Pulse Rate 08/13/19 2128 100     Resp 08/13/19 2128 18     Temp 08/13/19 2128 99 F (37.2 C)     Temp Source 08/13/19 2128 Oral     SpO2 08/13/19 2128 98 %     Weight 08/13/19 2129 204 lb (92.5 kg)     Height 08/13/19 2129 6\' 2"  (1.88 m)     Head Circumference --      Peak Flow --      Pain Score 08/13/19 2151 0     Pain Loc --      Pain Edu? --      Excl. in [place]? --     Constitutional: Alert and oriented. Well appearing and in no acute distress. Eyes: Conjunctivae are normal.  Head: Atraumatic. Nose: No congestion/rhinnorhea. Mouth/Throat: Mucous membranes are moist.   Neck: Normal range of motion.  Cardiovascular: Normal rate, regular rhythm.   Good peripheral circulation. Respiratory: Normal respiratory effort.  No retractions.  Gastrointestinal: No distention.  Musculoskeletal: No lower extremity edema.  Extremities warm and well perfused.  Right second digit dorsal DIP joint area with an approximately 2-3 mm abrasion, with faintly visible possible tiny foreign body. Neurologic:  Normal speech and language. No gross focal neurologic deficits are appreciated.  Skin:  Skin is warm and dry. No rash noted. Psychiatric: Calm and cooperative.  ____________________________________________   LABS (all labs ordered are listed, but only abnormal results are displayed)  Labs Reviewed - No data to display ____________________________________________  EKG   ____________________________________________  RADIOLOGY    ____________________________________________   PROCEDURES  Procedure(s) performed: Yes  .Foreign Body Removal  Date/Time:  08/13/2019 11:14 PM Performed by: Dionne Bucy, MD Authorized by: Dionne Bucy, MD  Consent: Verbal consent obtained. Consent given by: patient Patient understanding: patient states understanding of the procedure being performed Anesthesia: local infiltration  Anesthesia: Local Anesthetic: lidocaine 1% without epinephrine Anesthetic total: 2 mL  Sedation: Patient sedated: no  Complexity: simple 1 objects recovered. Post-procedure assessment: foreign body removed Patient tolerance: patient tolerated the procedure well with no immediate complications    Critical Care performed: No ____________________________________________   INITIAL IMPRESSION / ASSESSMENT AND PLAN / ED COURSE  Pertinent labs & imaging results that were available during my care of the patient were reviewed by me and considered in my medical decision making (see chart for details).  34 year old male with a history of Asperger's disorder and other PMH as noted above presents for suicidal ideation.  I reviewed the past medical records in Epic.  The patient has already had several visits to this ED over the last several days, and has been cleared by psychiatry.  He has been diagnosed with malingering, and there has been concern that his complaint of suicidal ideation is related to homelessness.  The patient was discharged earlier today with a plan to go to the [facility] rescue mission, however apparently he then told the police he was feeling suicidal again.  He was evaluated at [facility].  I spoke to Dr. Sharyne Richters from [facility] on the phone.  She is very concerned that the patient is actually suicidal and a risk to himself, and feels that he should be committed and treated as an inpatient.  We will obtain a psychiatry consult and reassess.  At this time there is no indication for lab work-up.  In addition the patient reports an apparent splinter from running through her abortion a few days ago.  He has a tiny  lesion on the right second DIP joint area which had what appeared to be a small <1 millimeter foreign body consistent with a tiny splinter.  I anesthetized the area and was able to remove with forceps what appeared to be a tiny wood type foreign body with no complications.  At this time the patient is medically cleared.  Disposition will be based on psychiatry team recommendations.    ____________________________________________   FINAL CLINICAL IMPRESSION(S) / ED DIAGNOSES  Final diagnoses:  Suicidal ideation      NEW MEDICATIONS STARTED DURING THIS VISIT:  New Prescriptions   No medications on file     Note:  This document was prepared using Dragon voice recognition software and may include unintentional dictation errors.    Dionne Bucy, MD 08/13/19 2314

## 2019-08-13 NOTE — ED Provider Notes (Signed)
-----------------------------------------   5:56 AM on 08/13/2019 -----------------------------------------   Blood pressure 132/83, pulse 83, temperature 98.1 F (36.7 C), temperature source Oral, resp. rate 16, height 6\' 2"  (1.88 m), weight 92.5 kg, SpO2 98 %.  The patient is sleeping at this time.  There have been no acute events since the last update.  Awaiting disposition plan from Behavioral Medicine.   , MD 08/13/19 [phone]

## 2019-08-13 NOTE — ED Notes (Signed)
Mr. Paul Booker refused to speak with the TTS worker.  He stated, "I was told at [facility] that I would not have to speak to any psychiatrist here". Patient further stated that he was to be transported to [facility] in the Morning.  TTS spoke with behavior health intake, at [facility].  She stated that Paul Booker was informed when he was at [facility], that [facility] is not accepting any outside referral patients at this time, and he would need to come to their ED to be assessed for admittance to the hospital.

## 2019-08-13 NOTE — ED Notes (Signed)
Pt given resources by TTS.
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
## 2019-08-13 NOTE — BH Assessment (Signed)
TTS provided a list of local resources for mental health outpatient treatment.  Unable to provide pt with a (PART) bus pass to assist pt with transport to local shelter - passes expired December 2020.

## 2019-08-13 NOTE — ED Notes (Signed)
Pt given 5/5 belongings bag back. Pt changed into new clothes. Pt given paperwork with two bus passes and resources. Pt given directions for going to [place] [facility] tomorrow morning. Pt cries but replies "thank you". Pt agrees to give it a chance.
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
## 2019-08-13 NOTE — ED Notes (Signed)
Pt crying again stating "I need help". Pt was asked about taking a bus to [place] or [place] [facility] and pt states "I guess" while crying and states that his clothes are all at a church in [place] and he can't get them until tomorrow morning. Asher Muir, NP notified.

## 2019-08-13 NOTE — ED Notes (Signed)
Pt given lunch tray.

## 2019-08-14 NOTE — ED Notes (Signed)
Hourly rounding reveals patient in room. No complaints, stable, in no acute distress. Q15 minute rounds and monitoring via Rover and Officer to continue.   

## 2019-08-14 NOTE — ED Notes (Signed)
Patient cleared for discharge by psych. IVC rescinded. Patient given back 3 bags of belongings and bus pass. Discharge papers and resources reviewed with patient. Patient refusing discharge vitals and to sign for discharge. Patient ambulatory to lobby with this RN. Steady gait and NAD noted.

## 2019-08-14 NOTE — ED Provider Notes (Signed)
-----------------------------------------   11:58 AM on 08/14/2019 -----------------------------------------   Blood pressure 135/72, pulse 100, temperature 99 F (37.2 C), temperature source Oral, resp. rate 18, height 6\' 2"  (1.88 m), weight 92.5 kg, SpO2 98 %.  The patient is calm and cooperative at this time.  There have been no acute events since the last update.  Case discussed with psychiatry after Dr. evaluation of the patient.  He finds his presentation to be very consistent with malingering.  He has rescinded IVC, recommends discharge.     Holley Raring, MD 08/14/19 1159

## 2019-08-14 NOTE — Consult Note (Signed)
34 year old male patient reassessed this morning for suicidal thoughts and depression.  Upon evaluation, patient cannot clearly explain why he is depressed and offers vague explanations for his symptoms not consistent with previous story or with serious psychiatric illness.  During the course of the evaluation patient made numerous conditional suicidal statements with a suspected secondary gain of finding placement as patient is currently homeless.  Due to the nature of the patient's statements as well as his overall presentation, Clinical research associate agrees with previous notes from colleagues that patient is likely not in acute risk and is instead malingering.  IVC rescinded, patient to be discharged

## 2019-12-03 ENCOUNTER — Other Ambulatory Visit: Payer: Self-pay

## 2019-12-03 ENCOUNTER — Emergency Department
Admission: EM | Admit: 2019-12-03 | Discharge: 2019-12-03 | Disposition: A | Discharge status: Home / Self Care | Payer: Medicaid Other | Attending: Emergency Medicine | Admitting: Emergency Medicine

## 2019-12-03 DIAGNOSIS — R45851 Suicidal ideations: Secondary | ICD-10-CM | POA: Insufficient documentation

## 2019-12-03 DIAGNOSIS — F329 Major depressive disorder, single episode, unspecified: Secondary | ICD-10-CM | POA: Insufficient documentation

## 2019-12-03 DIAGNOSIS — Y907 Blood alcohol level of 200-239 mg/100 ml: Secondary | ICD-10-CM | POA: Insufficient documentation

## 2019-12-03 DIAGNOSIS — Z20822 Contact with and (suspected) exposure to covid-19: Secondary | ICD-10-CM | POA: Insufficient documentation

## 2019-12-03 DIAGNOSIS — F845 Asperger's syndrome: Secondary | ICD-10-CM | POA: Insufficient documentation

## 2019-12-03 DIAGNOSIS — F17210 Nicotine dependence, cigarettes, uncomplicated: Secondary | ICD-10-CM | POA: Insufficient documentation

## 2019-12-03 DIAGNOSIS — F1010 Alcohol abuse, uncomplicated: Secondary | ICD-10-CM

## 2019-12-03 DIAGNOSIS — F10129 Alcohol abuse with intoxication, unspecified: Secondary | ICD-10-CM | POA: Insufficient documentation

## 2019-12-03 LAB — CBC
HCT: 44.8 % (ref 39.0–52.0)
Hemoglobin: 15.2 g/dL (ref 13.0–17.0)
MCH: 30 pg (ref 26.0–34.0)
MCHC: 33.9 g/dL (ref 30.0–36.0)
MCV: 88.5 fL (ref 80.0–100.0)
Platelets: 272 10*3/uL (ref 150–400)
RBC: 5.06 MIL/uL (ref 4.22–5.81)
RDW: 12.8 % (ref 11.5–15.5)
WBC: 8.3 10*3/uL (ref 4.0–10.5)
nRBC: 0 % (ref 0.0–0.2)

## 2019-12-03 LAB — COMPREHENSIVE METABOLIC PANEL
ALT: 14 U/L (ref 0–44)
AST: 19 U/L (ref 15–41)
Albumin: 4.6 g/dL (ref 3.5–5.0)
Alkaline Phosphatase: 55 U/L (ref 38–126)
Anion gap: 10 (ref 5–15)
BUN: 8 mg/dL (ref 6–20)
CO2: 26 mmol/L (ref 22–32)
Calcium: 9.1 mg/dL (ref 8.9–10.3)
Chloride: 107 mmol/L (ref 98–111)
Creatinine, Ser: 0.8 mg/dL (ref 0.61–1.24)
GFR calc Af Amer: >60 mL/min (ref >60)
GFR calc non Af Amer: >60 mL/min (ref >60)
Glucose, Bld: 92 mg/dL (ref 70–99)
Potassium: 3.7 mmol/L (ref 3.5–5.1)
Sodium: 143 mmol/L (ref 135–145)
Total Bilirubin: 0.6 mg/dL (ref 0.3–1.2)
Total Protein: 7.9 g/dL (ref 6.5–8.1)

## 2019-12-03 LAB — ETHANOL: Alcohol, Ethyl (B): 231 mg/dL — ABNORMAL HIGH (ref <10)

## 2019-12-03 LAB — RESPIRATORY PANEL BY RT PCR (FLU A&B, COVID)
Influenza A by PCR: NEGATIVE
Influenza B by PCR: NEGATIVE
SARS Coronavirus 2 by RT PCR: NEGATIVE

## 2019-12-03 LAB — ACETAMINOPHEN LEVEL: Acetaminophen (Tylenol), Serum: <10 ug/mL — ABNORMAL LOW (ref 10–30)

## 2019-12-03 LAB — SALICYLATE LEVEL: Salicylate Lvl: <7 mg/dL — ABNORMAL LOW (ref 7.0–30.0)

## 2019-12-03 MED ORDER — THIAMINE HCL 100 MG/ML IJ SOLN: 100.0000 mg | Freq: Every day | INTRAMUSCULAR | Status: DC

## 2019-12-03 MED ORDER — LORAZEPAM 2 MG PO TABS: 0.0000 mg | ORAL_TABLET | Freq: Four times a day (QID) | ORAL | Status: DC

## 2019-12-03 MED ORDER — LORAZEPAM 2 MG/ML IJ SOLN: 0.0000 mg | Freq: Four times a day (QID) | INTRAMUSCULAR | Status: DC

## 2019-12-03 MED ORDER — LORAZEPAM 2 MG/ML IJ SOLN: 0.0000 mg | Freq: Two times a day (BID) | INTRAMUSCULAR | Status: DC

## 2019-12-03 MED ORDER — LORAZEPAM 2 MG/ML IJ SOLN
2.0000 mg | Freq: Once | INTRAMUSCULAR | Status: DC
  Filled 2019-12-03: qty 1

## 2019-12-03 MED ORDER — DROPERIDOL 2.5 MG/ML IJ SOLN
5.0000 mg | Freq: Once | INTRAMUSCULAR | Status: AC
  Administered 2019-12-03: 5 mg via INTRAVENOUS
  Filled 2019-12-03: qty 2

## 2019-12-03 MED ORDER — LORAZEPAM 2 MG PO TABS: 0.0000 mg | ORAL_TABLET | Freq: Two times a day (BID) | ORAL | Status: DC

## 2019-12-03 MED ORDER — THIAMINE HCL 100 MG PO TABS: 100.0000 mg | ORAL_TABLET | Freq: Every day | ORAL | Status: DC

## 2019-12-03 NOTE — BH Assessment (Signed)
Writer spoke with the patient to complete an updated/reassessment. Patient denies SI/HI and AV/H. 

## 2019-12-03 NOTE — BH Assessment (Signed)
Assessment Note  Paul Booker is an 34 y.o. male presenting to [facility] ED voluntarily. Per triage note Patient here with [facility] PD voluntarily for psych eval.  Patient states here because "I'm fucked up and I'm back to the same place I was in January."  Patient had 113 days sober and drank alcohol tonight and having SI. During assessment patient was alert and oriented x4, calm and cooperative. Patient reported why he was presenting to ED "so it's been 113 days sober and for the last week I've been drinking, nothing has changed in the last 100 days." Patient reports that he is still currently homeless "I live in a tent." Patient does however report that he is currently employed full-time. Patient reports that he is currently suicidal with a plan "I want to jump off a bridge and break my legs." "I spent my $12,000 stimulus check on Crack cocaine." When asked if patient is interested in detox treatment patient reported "no, I don't know what I need." Patient BAL is 231.  Per Psyc NP patient will be observed overnight and reassessed in the morning.  Diagnosis: Alcohol Abuse  Past Medical History:  Past Medical History:  Diagnosis Date  . Asperger's disorder   . Syncope and collapse     Past Surgical History:  Procedure Laterality Date  . adnoids    . adnoids removed      Family History:  Family History  Problem Relation Age of Onset  . Heart disease Mother   . Hypertension Mother   . Diabetes Father   . Asthma Brother     Social History:  reports that he has been smoking cigarettes. He has a 11.00 pack-year smoking history. He has never used smokeless tobacco. He reports current alcohol use. He reports current drug use. Drugs: Marijuana, Cocaine, and Methamphetamines.  Additional Social History:  Alcohol / Drug Use Pain Medications: See MAR Prescriptions: See MAR Over the Counter: See MAR History of alcohol / drug use?: Yes Substance #1 Name of Substance 1: Alcohol  CIWA:  CIWA-Ar BP: (!) 184/138 Pulse Rate: 85 Nausea and Vomiting: no nausea and no vomiting Tactile Disturbances: none Tremor: no tremor Auditory Disturbances: not present Paroxysmal Sweats: no sweat visible Visual Disturbances: not present Anxiety: mildly anxious Headache, Fullness in Head: none present Agitation: normal activity Orientation and Clouding of Sensorium: cannot do serial additions or is uncertain about date CIWA-Ar Total: 2 COWS:    Allergies:  Allergies  Allergen Reactions  . Concerta [Methylphenidate]     Tongue swelling    Home Medications: (Not in a hospital admission)   OB/GYN Status:  No LMP for male patient.  General Assessment Data Location of Assessment: [facility] ED TTS Assessment: In system Is this a Tele or Face-to-Face Assessment?: Face-to-Face Is this an Initial Assessment or a Re-assessment for this encounter?: Initial Assessment Patient Accompanied by:: N/A Language Other than English: No Living Arrangements: Homeless/Shelter What gender do you identify as?: Male Marital status: Single Living Arrangements: Other (Comment)(Homeless) Can pt return to current living arrangement?: Yes Admission Status: Voluntary Is patient capable of signing voluntary admission?: Yes Referral Source: Self/Family/Friend Insurance type: None  Medical Screening Exam [facility] Walk-in ONLY) Medical Exam completed: Yes  Crisis Care Plan Living Arrangements: Other (Comment)(Homeless) Legal Guardian: Other:(Self) Name of Psychiatrist: None Name of Therapist: None  Education Status Is patient currently in school?: No Is the patient employed, unemployed or receiving disability?: Employed  Risk to self with the past 6 months Suicidal Ideation: Yes-Currently Present Has  patient been a risk to self within the past 6 months prior to admission? : Yes Suicidal Intent: Yes-Currently Present Has patient had any suicidal intent within the past 6 months prior to admission? :  Yes Is patient at risk for suicide?: Yes Suicidal Plan?: Yes-Currently Present Has patient had any suicidal plan within the past 6 months prior to admission? : Yes Specify Current Suicidal Plan: "Jump off a brige and break my legs" Access to Means: Yes Specify Access to Suicidal Means: Patient has access to a bridge What has been your use of drugs/alcohol within the last 12 months?: Alcohol Previous Attempts/Gestures: (Unknown) How many times?: (Unknown) Other Self Harm Risks: Alcohol Abuse Triggers for Past Attempts: Unknown Intentional Self Injurious Behavior: None Family Suicide History: Unknown Recent stressful life event(s): Financial Problems, Other (Comment)(Currently homeless) Persecutory voices/beliefs?: No Depression: Yes Depression Symptoms: Isolating, Loss of interest in usual pleasures, Feeling worthless/self pity Substance abuse history and/or treatment for substance abuse?: Yes Suicide prevention information given to non-admitted patients: Not applicable  Risk to Others within the past 6 months Homicidal Ideation: No Does patient have any lifetime risk of violence toward others beyond the six months prior to admission? : No Thoughts of Harm to Others: No Current Homicidal Intent: No Current Homicidal Plan: No Access to Homicidal Means: No History of harm to others?: No Assessment of Violence: None Noted Does patient have access to weapons?: No Criminal Charges Pending?: No Does patient have a court date: No Is patient on probation?: No  Psychosis Hallucinations: None noted Delusions: None noted  Mental Status Report Appearance/Hygiene: In scrubs Eye Contact: Poor Motor Activity: Freedom of movement Speech: Logical/coherent Level of Consciousness: Alert Mood: Depressed, Sad Affect: Depressed, Sad Anxiety Level: Minimal Thought Processes: Coherent Judgement: Impaired Orientation: Person, Place, Time, Situation, Appropriate for developmental  age Obsessive Compulsive Thoughts/Behaviors: None  Cognitive Functioning Concentration: Normal Memory: Recent Intact, Remote Intact Is patient IDD: No Insight: Fair Impulse Control: Fair Appetite: Good Have you had any weight changes? : No Change Sleep: No Change Total Hours of Sleep: 5 Vegetative Symptoms: None  ADLScreening [facility] Assessment Services) Patient's cognitive ability adequate to safely complete daily activities?: Yes Patient able to express need for assistance with ADLs?: Yes Independently performs ADLs?: Yes (appropriate for developmental age)  Prior Inpatient Therapy Prior Inpatient Therapy: (Unknown)  Prior Outpatient Therapy Prior Outpatient Therapy: No Does patient have an ACCT team?: No Does patient have Intensive In-House Services?  : No Does patient have [facility] services? : No Does patient have P4CC services?: No  ADL Screening (condition at time of admission) Patient's cognitive ability adequate to safely complete daily activities?: Yes Is the patient deaf or have difficulty hearing?: No Does the patient have difficulty seeing, even when wearing glasses/contacts?: No Does the patient have difficulty concentrating, remembering, or making decisions?: No Patient able to express need for assistance with ADLs?: Yes Does the patient have difficulty dressing or bathing?: No Independently performs ADLs?: Yes (appropriate for developmental age) Does the patient have difficulty walking or climbing stairs?: No Weakness of Legs: None Weakness of Arms/Hands: None  Home Assistive Devices/Equipment Home Assistive Devices/Equipment: None  Therapy Consults (therapy consults require a physician order) PT Evaluation Needed: No OT Evalulation Needed: No SLP Evaluation Needed: No Abuse/Neglect Assessment (Assessment to be complete while patient is alone) Abuse/Neglect Assessment Can Be Completed: Unable to assess, patient is non-responsive or altered mental  status Values / Beliefs Cultural Requests During Hospitalization: None Spiritual Requests During Hospitalization: None Consults Spiritual Care  Consult Needed: No Transition of Care Team Consult Needed: No Advance Directives (For Healthcare) Does Patient Have a Medical Advance Directive?: No          Disposition: Per Psyc NP patient will be observed overnight and reassessed in the morning. Disposition Initial Assessment Completed for this Encounter: Yes  On Site Evaluation by:   Reviewed with Physician:    Benay Pike MS LCASA 12/03/2019 3:36 AM

## 2019-12-03 NOTE — ED Notes (Signed)
Pt agitated, punching bed and yelling for [place] to let him leave.

## 2019-12-03 NOTE — ED Notes (Signed)
Patient walked out of Ambulance entrance and around to [place] where he stated he needed his "stuff so I can go play in traffic". Patient brought back to [place] by PD since Dr. Larinda Buttery stated he was going to IVC patient. Patient continues to be verbally aggressive.

## 2019-12-03 NOTE — ED Provider Notes (Signed)
[facility] Emergency Department Provider Note   ____________________________________________   First MD Initiated Contact with Patient 12/03/19 [phone]     (approximate)  I have reviewed the triage vital signs and the nursing notes.   HISTORY  Chief Complaint Psychiatric Evaluation    HPI Paul Booker is a 34 y.o. male with possible history of Asperger's disorder and alcohol abuse who presents to the ED for psychiatric evaluation.  Patient states that "I am in the same place I was back in January".  He states that he has started drinking again and is now having thoughts of ending his life.  He states he has been drinking consistently for 1 week, denies drug use beyond marijuana.  He states that he has a plan of jumping off a bridge, but decided to come to the ED voluntarily and before doing this.  He is requesting psychiatric evaluation and denies any medical complaints at this time.        Past Medical History:  Diagnosis Date  . Asperger's disorder   . Syncope and collapse     Patient Active Problem List   Diagnosis Date Noted  . Alcohol abuse 12/03/2019  . 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 Malingering 08/11/2019    Past Surgical History:  Procedure Laterality Date  . adnoids    . adnoids removed      Prior to Admission medications   Not on File    Allergies Concerta [methylphenidate]  Family History  Problem Relation Age of Onset  . Heart disease Mother   . Hypertension Mother   . Diabetes Father   . Asthma Brother     Social History Social History   Tobacco Use  . Smoking status: Current Every Day Smoker    Packs/day: 1.00    Years: 11.00    Pack years: 11.00    Types: Cigarettes  . Smokeless tobacco: Never Used  Substance Use Topics  . Alcohol use: Yes    Comment: 1 beer tonight  . Drug use: Yes    Types: Marijuana, Cocaine, Methamphetamines    Comment: twice a week     Review of Systems  Constitutional: No fever/chills Eyes: No visual changes. ENT: No sore throat. Cardiovascular: Denies chest pain. Respiratory: Denies shortness of breath. Gastrointestinal: No abdominal pain.  No nausea, no vomiting.  No diarrhea.  No constipation. Genitourinary: Negative for dysuria. Musculoskeletal: Negative for back pain. Skin: Negative for rash. Neurological: Negative for headaches, focal weakness or numbness.  Positive for suicidal ideation.  ____________________________________________   PHYSICAL EXAM:  VITAL SIGNS: ED Triage Vitals  Enc Vitals Group     BP 12/03/19 0023 (!) 184/138     Pulse Rate 12/03/19 0023 85     Resp 12/03/19 0023 20     Temp 12/03/19 0023 98.3 F (36.8 C)     Temp Source 12/03/19 0023 Oral     SpO2 12/03/19 0023 98 %     Weight 12/03/19 0025 180 lb (81.6 kg)     Height 12/03/19 0025 6\' 3"  (1.905 m)     Head Circumference --      Peak Flow --      Pain Score 12/03/19 0025 0     Pain Loc --      Pain Edu? --      Excl. in [place]? --     Constitutional: Alert and oriented.  Intoxicated appearing. Eyes: Conjunctivae are normal. Head: Atraumatic. Nose: No  congestion/rhinnorhea. Mouth/Throat: Mucous membranes are moist. Neck: Normal ROM Cardiovascular: Normal rate, regular rhythm. Grossly normal heart sounds. Respiratory: Normal respiratory effort.  No retractions. Lungs CTAB. Gastrointestinal: Soft and nontender. No distention. Genitourinary: deferred Musculoskeletal: No lower extremity tenderness nor edema. Neurologic:  Normal speech and language. No gross focal neurologic deficits are appreciated. Skin:  Skin is warm, dry and intact. No rash noted. Psychiatric: Mood and affect are normal. Speech and behavior are normal.  ____________________________________________   LABS (all labs ordered are listed, but only abnormal results are displayed)  Labs Reviewed  ETHANOL - Abnormal; Notable for the following  components:      Result Value   Alcohol, Ethyl (B) 231 (*)    All other components within normal limits  SALICYLATE LEVEL - Abnormal; Notable for the following components:   Salicylate Lvl <7.0 (*)    All other components within normal limits  ACETAMINOPHEN LEVEL - Abnormal; Notable for the following components:   Acetaminophen (Tylenol), Serum <10 (*)    All other components within normal limits  RESPIRATORY PANEL BY RT PCR (FLU A&B, COVID)  COMPREHENSIVE METABOLIC PANEL  CBC  URINE DRUG SCREEN, QUALITATIVE ([facility] ONLY)     PROCEDURES  Procedure(s) performed (including Critical Care):  Procedures   ____________________________________________   INITIAL IMPRESSION / ASSESSMENT AND PLAN / ED COURSE       34 year old male with history of Asperger's syndrome and alcohol abuse presents to the ED for suicidal ideation in the setting of drinking alcohol after being sober for 113 days.  He is requesting psychiatric evaluation, is calm and cooperative at this time and we will maintain his voluntary status.  He denies any medical planes and lab work is unremarkable outside of elevated blood alcohol, he is medically cleared for psychiatric evaluation.  The patient has been placed in psychiatric observation due to the need to provide a safe environment for the patient while obtaining psychiatric consultation and evaluation, as well as ongoing medical and medication management to treat the patient's condition.  The patient has not been placed under full IVC at this time.  ----------------------------------------- 7:17 AM on 12/03/2019 -----------------------------------------  Patient initially eloped from the department but then returned to request his stuff back and stated that he had a plan to go "playing in traffic".  Given this statement, decision was made to place patient under IVC and have him brought back to his room.  He became increasingly agitated and decision was made to give  dose of droperidol to ensure his safety.  Psychiatry to reevaluate this morning.       ____________________________________________   FINAL CLINICAL IMPRESSION(S) / ED DIAGNOSES  Final diagnoses:  Suicidal ideation     ED Discharge Orders    None       Note:  This document was prepared using Dragon voice recognition software and may include unintentional dictation errors.   Chesley Noon, MD 12/03/19 [phone]

## 2019-12-03 NOTE — Consult Note (Signed)
[facility] Face-to-Face Psychiatry Consult   Reason for Consult:  Psych evaluation Referring Physician:   Patient Identification: Paul Booker MRN:  [id number] Principal Diagnosis: Alcohol abuse  Diagnosis:  Principal Problem:   Alcohol abuse   Total Time spent with patient: 45 minutes  Subjective:   Paul Booker is a 34 y.o. male patient admitted with "I started drinking after 113 days sober"  HPI:  Psych Assessment  Paul Booker, 34 y.o., male patient presented to [facility].  Patient seen face to face   By TTS and this provider; chart reviewed and consulted with Dr. Lucianne Muss on 12/03/19.  On evaluation Paul Booker reports taht he has been sober for 113 days and now has started back drinking. Patient is very upset by this and now states that he has been thinking about suicide. [place] a job.  He says he cannot get hs life together because he spends his money on alcohol. When asked if patient would like alcohol treatment, pt states that he does not know what he wants. Patient current BAL is 234.   [name] is  is alert/oriented x 4; depressed/remorseful/intoxicated; and mood congruent with affect.  Patient is speaking in a clear tone at moderate volume, and normal pace; with poor eye contact.  His thought process is coherent and relevant; There is no indication that he is currently responding to internal/external stimuli or experiencing delusional thought content.  Patient has passive SI and denies HI.  Past Psychiatric History: Aspergers and alcohol abuse  Risk to Self:  no Risk to Others:  no Prior Inpatient Therapy:  yes Prior Outpatient Therapy:  yes  Past Medical History:  Past Medical History:  Diagnosis Date  . Asperger's disorder   . Syncope and collapse     Past Surgical History:  Procedure Laterality Date  . adnoids    . adnoids removed     Family History:  Family History  Problem Relation Age of Onset  . Heart disease Mother    . Hypertension Mother   . Diabetes Father   . Asthma Brother    Family Psychiatric  History: unknown Social History:  Social History   Substance and Sexual Activity  Alcohol Use Yes   Comment: 1 beer tonight     Social History   Substance and Sexual Activity  Drug Use Yes  . Types: Marijuana, Cocaine, Methamphetamines   Comment: twice a week    Social History   Socioeconomic History  . Marital status: Single    Spouse name: Not on file  . Number of children: Not on file  . Years of education: Not on file  . Highest education level: Not on file  Occupational History  . Not on file  Tobacco Use  . Smoking status: Current Every Day Smoker    Packs/day: 1.00    Years: 11.00    Pack years: 11.00    Types: Cigarettes  . Smokeless tobacco: Never Used  Substance and Sexual Activity  . Alcohol use: Yes    Comment: 1 beer tonight  . Drug use: Yes    Types: Marijuana, Cocaine, Methamphetamines    Comment: twice a week  . Sexual activity: Not on file  Other Topics Concern  . Not on file  Social History Narrative  . Not on file   Social Determinants of Health   Financial Resource Strain:   . Difficulty of Paying Living Expenses:   Food Insecurity:   . Worried  About Running Out of Food in the Last Year:   . Ran Out of Food in the Last Year:   Transportation Needs:   . Lack of Transportation (Medical):   Marland Kitchen Lack of Transportation (Non-Medical):   Physical Activity:   . Days of Exercise per Week:   . Minutes of Exercise per Session:   Stress:   . Feeling of Stress :   Social Connections:   . Frequency of Communication with Friends and Family:   . Frequency of Social Gatherings with Friends and Family:   . Attends Religious Services:   . Active Member of Clubs or Organizations:   . Attends Banker Meetings:   Marland Kitchen Marital Status:    Additional Social History:    Allergies:   Allergies  Allergen Reactions  . Concerta [Methylphenidate]     Tongue  swelling    Labs:  Results for orders placed or performed during the hospital encounter of 12/03/19 (from the past 48 hour(s))  Comprehensive metabolic panel     Status: None   Collection Time: 12/03/19 12:28 AM  Result Value Ref Range   Sodium 143 135 - 145 mmol/L   Potassium 3.7 3.5 - 5.1 mmol/L   Chloride 107 98 - 111 mmol/L   CO2 26 22 - 32 mmol/L   Glucose, Bld 92 70 - 99 mg/dL    Comment: Glucose reference range applies only to samples taken after fasting for at least 8 hours.   BUN 8 6 - 20 mg/dL   Creatinine, Ser 6.75 0.61 - 1.24 mg/dL   Calcium 9.1 8.9 - 91.6 mg/dL   Total Protein 7.9 6.5 - 8.1 g/dL   Albumin 4.6 3.5 - 5.0 g/dL   AST 19 15 - 41 U/L   ALT 14 0 - 44 U/L   Alkaline Phosphatase 55 38 - 126 U/L   Total Bilirubin 0.6 0.3 - 1.2 mg/dL   GFR calc non Af Amer >60 >60 mL/min   GFR calc Af Amer >60 >60 mL/min   Anion gap 10 5 - 15    Comment: Performed at [facility], [address], [place], [place] [address]  Ethanol     Status: Abnormal   Collection Time: 12/03/19 12:28 AM  Result Value Ref Range   Alcohol, Ethyl (B) 231 (H) <10 mg/dL    Comment: (NOTE) Lowest detectable limit for serum alcohol is 10 mg/dL. For medical purposes only. Performed at [facility], [address] [place], [place] [address]   Salicylate level     Status: Abnormal   Collection Time: 12/03/19 12:28 AM  Result Value Ref Range   Salicylate Lvl <7.0 (L) 7.0 - 30.0 mg/dL    Comment: Performed at [facility], [address] [place], [place] [address]  Acetaminophen level     Status: Abnormal   Collection Time: 12/03/19 12:28 AM  Result Value Ref Range   Acetaminophen (Tylenol), Serum <10 (L) 10 - 30 ug/mL    Comment: (NOTE) Therapeutic concentrations vary significantly. A range of 10-30 ug/mL  may be an effective concentration for many patients. However, some  are best treated at concentrations outside of this range. Acetaminophen concentrations  >150 ug/mL at 4 hours after ingestion  and >50 ug/mL at 12 hours after ingestion are often associated with  toxic reactions. Performed at [facility], [address], [place], [place] [address]   cbc     Status: None   Collection Time: 12/03/19 12:28 AM  Result Value Ref Range   WBC 8.3 4.0 - 10.5 K/uL   RBC 5.06 4.22 - 5.81 MIL/uL   Hemoglobin 15.2 13.0 - 17.0 g/dL   HCT 16.1 09.6 - 04.5 %   MCV 88.5 80.0 - 100.0 fL   MCH 30.0 26.0 - 34.0 pg   MCHC 33.9 30.0 - 36.0 g/dL   RDW 40.9 81.1 - 91.4 %   Platelets 272 150 - 400 K/uL   nRBC 0.0 0.0 - 0.2 %    Comment: Performed at [facility], [address], [place], [place] [address]  Respiratory Panel by RT PCR (Flu A&B, Covid) - Nasopharyngeal Swab     Status: None   Collection Time: 12/03/19  2:13 AM   Specimen: Nasopharyngeal Swab  Result Value Ref Range   SARS Coronavirus 2 by RT PCR NEGATIVE NEGATIVE    Comment: (NOTE) SARS-CoV-2 target nucleic acids are NOT DETECTED. The SARS-CoV-2 RNA is generally detectable in upper respiratoy specimens during the acute phase of infection. The lowest concentration of SARS-CoV-2 viral copies this assay can detect is 131 copies/mL. A negative result does not preclude SARS-Cov-2 infection and should not be used as the sole basis for treatment or other patient management decisions. A negative result may occur with  improper specimen collection/handling, submission of specimen other than nasopharyngeal swab, presence of viral mutation(s) within the areas targeted by this assay, and inadequate number of viral copies (<131 copies/mL). A negative result must be combined with clinical observations, patient history, and epidemiological information. The expected result is Negative. Fact Sheet for Patients:  https://www.moore.com/ Fact Sheet for Healthcare Providers:  https://www.young.biz/ This test is not yet ap proved or cleared by the  [place] FDA and  has been authorized for detection and/or diagnosis of SARS-CoV-2 by FDA under an Emergency Use Authorization (EUA). This EUA will remain  in effect (meaning this test can be used) for the duration of the COVID-19 declaration under Section 564(b)(1) of the Act, 21 U.S.C. section 360bbb-3(b)(1), unless the authorization is terminated or revoked sooner.    Influenza A by PCR NEGATIVE NEGATIVE   Influenza B by PCR NEGATIVE NEGATIVE    Comment: (NOTE) The Xpert Xpress SARS-CoV-2/FLU/RSV assay is intended as an aid in  the diagnosis of influenza from Nasopharyngeal swab specimens and  should not be used as a sole basis for treatment. Nasal washings and  aspirates are unacceptable for Xpert Xpress SARS-CoV-2/FLU/RSV  testing. Fact Sheet for Patients: https://www.moore.com/ Fact Sheet for Healthcare Providers: https://www.young.biz/ This test is not yet approved or cleared by the [place] FDA and  has been authorized for detection and/or diagnosis of SARS-CoV-2 by  FDA under an Emergency Use Authorization (EUA). This EUA will remain  in effect (meaning this test can be used) for the duration of the  Covid-19 declaration under Section 564(b)(1) of the Act, 21  U.S.C. section 360bbb-3(b)(1), unless the authorization is  terminated or revoked. Performed at [facility], [address], [place], [place] [address]     Current Facility-Administered Medications  Medication Dose Route Frequency Provider Last Rate Last Admin  . LORazepam (ATIVAN) injection 0-4 mg  0-4 mg Intravenous Q6H Chesley Noon, MD       Or  . LORazepam (ATIVAN) tablet 0-4 mg  0-4 mg Oral Q6H Chesley Noon, MD   Stopped at 12/03/19 0130  . [START ON 12/05/2019] LORazepam (ATIVAN) injection 0-4 mg  0-4 mg Intravenous Q12H Chesley Noon, MD       Or  . [  START ON 12/05/2019] LORazepam (ATIVAN) tablet 0-4 mg  0-4 mg Oral Q12H Blake Divine, MD       . thiamine tablet 100 mg  100 mg Oral Daily Blake Divine, MD       Or  . thiamine (B-1) injection 100 mg  100 mg Intravenous Daily Blake Divine, MD       No current outpatient medications on file.    Musculoskeletal: Strength & Muscle Tone: within normal limits Gait & Station: normal Patient leans: N/A  Psychiatric Specialty Exam: Physical Exam  Nursing note and vitals reviewed. Constitutional: He is oriented to person, place, and time. He appears well-developed and well-nourished.  HENT:  Head: Normocephalic and atraumatic.  Respiratory: Effort normal.  Musculoskeletal:        General: Normal range of motion.     Cervical back: Normal range of motion.  Neurological: He is alert and oriented to person, place, and time.  Skin: Skin is warm and dry.  Psychiatric: Thought content normal. His affect is labile. His speech is slurred. He is agitated. Cognition and memory are impaired. He expresses impulsivity. He exhibits a depressed mood.    Review of Systems  Psychiatric/Behavioral: Positive for dysphoric mood. Negative for self-injury and suicidal ideas.  All other systems reviewed and are negative.   Blood pressure (!) 184/138, pulse 85, temperature 98.3 F (36.8 C), temperature source Oral, resp. rate 20, height 6\' 3"  (1.905 m), weight 81.6 kg, SpO2 98 %.Body mass index is 22.5 kg/m.  General Appearance: Disheveled  Eye Contact:  Poor  Speech:  Clear and Coherent  Volume:  Normal  Mood:  Depressed and Hopeless  Affect:  Congruent  Thought Process:  Coherent and Descriptions of Associations: Intact  Orientation:  Full (Time, Place, and Person)  Thought Content:  WDL  Suicidal Thoughts:  passive   Homicidal Thoughts:  No  Memory:  Recent;   Poor  Judgement:  Impaired  Insight:  Lacking  Psychomotor Activity:  Normal  Concentration:  Attention Span: Fair  Recall:  [place] of Knowledge:  Good  Language:  Good  Akathisia:  NA  Handed:  Right  AIMS (if  indicated):     Assets:  Resilience  ADL's:  Intact  Cognition:  Impaired,  Mild  Sleep:        Treatment Plan Summary: Plan DC in the am if sober and mentally stable  Disposition: No evidence of imminent risk to self or others at present.   Patient does not meet criteria for psychiatric inpatient admission. reassess mental stability in the am  Deloria Lair, NP 12/03/2019 3:30 AM

## 2019-12-03 NOTE — ED Notes (Signed)
Pt asking when he can leave and if we can "speed up" the process.

## 2019-12-03 NOTE — ED Triage Notes (Signed)
Patient here with [place] [employer] voluntarily for psych eval.  Patient states here because "I'm fucked up and I'm back to the same place I was in January."  Patient had 113 days sober and drank alcohol tonight and having SI.

## 2019-12-03 NOTE — ED Provider Notes (Signed)
Patient cleared by psychiatry team they have asked me to rescind IVC on their behalf   Jene Every, MD 12/03/19 1453

## 2019-12-03 NOTE — ED Notes (Signed)
Hourly rounding reveals patient sleeping in room. No complaints, stable, in no acute distress. Q15 minute rounds and monitoring via Rover and Officer to continue.  

## 2019-12-03 NOTE — ED Notes (Signed)
Pt discharged home. VS stable. All belongings returned to patient - including valuables in safe.  Pt denies SI/HI and AVH.  Discharge instructions reviewed with pt. Pt given information about [facility]. Pt signed for discharge.

## 2019-12-03 NOTE — Consult Note (Signed)
Patient seen and re-assessed on morning rounds. He is alert and oriented x 4. He is calm and appropriate, appears to be engaging well with staff. He expresses his desire to go, and appears future oriented regarding getting help for his alcohol addiction. " I do not want to end up where I was. I got bored and had a drink. I feel like shit after drinking and dont want to do that again." His thought process is coherent and relevant. He denies suicidal ideation, homicidal ideations, and or hallucinations. He has no recent suicide attempts. Will psych clear at this time, may discharge with resources to [facility] for outpatient follow.

## 2019-12-03 NOTE — ED Notes (Signed)
NP and TTS at bedside. °

## 2019-12-03 NOTE — ED Notes (Signed)
Pt repeatedly saying, "Please let me out."  Pt has been told by staff and officer he has to wait on his paperwork to be completed.

## 2019-12-03 NOTE — ED Notes (Signed)
Pt. Transferred from Triage to 20 hall after dressing out and screening for contraband.  Pt. Oriented to [place] including Q15 minute rounds as well as Psychologist, counselling for their protection. Patient is alert and, warm and dry in no acute distress. Patient denies refuses to answer questions related to SI, HI, and AVH. Pt. Encouraged to let me know if needs arise.

## 2019-12-03 NOTE — ED Notes (Signed)
Pt agreeable to taking IM Droperidol per MD order. Water given to pt per pt request. Pt agreeable to comply with requests at this time.

## 2019-12-03 NOTE — ED Notes (Signed)
Pack cigarettes, lighter, phone charge and loose change in plastic bin provided to patient by [facility].  Jeans, white colored t-shirt, black colored ball cap - all placed into labeled belongings bag.  1 LG cell phone and $108 (1 $2 bill, 6 $1 bills, 2 $5 bills, 1 $10 bill, 4 $20 bills) placed in valuables envelope and given to [facility] with [facility].

## 2020-04-08 ENCOUNTER — Emergency Department: Payer: Medicaid Other

## 2020-04-08 ENCOUNTER — Encounter: Payer: Self-pay | Admitting: Emergency Medicine

## 2020-04-08 ENCOUNTER — Other Ambulatory Visit: Payer: Self-pay

## 2020-04-08 ENCOUNTER — Emergency Department
Admission: EM | Admit: 2020-04-08 | Discharge: 2020-04-09 | Disposition: A | Discharge status: Home / Self Care | Payer: Medicaid Other | Attending: Emergency Medicine | Admitting: Emergency Medicine

## 2020-04-08 DIAGNOSIS — Z20822 Contact with and (suspected) exposure to covid-19: Secondary | ICD-10-CM | POA: Insufficient documentation

## 2020-04-08 DIAGNOSIS — F17210 Nicotine dependence, cigarettes, uncomplicated: Secondary | ICD-10-CM | POA: Insufficient documentation

## 2020-04-08 DIAGNOSIS — R05 Cough: Secondary | ICD-10-CM | POA: Insufficient documentation

## 2020-04-08 DIAGNOSIS — R509 Fever, unspecified: Secondary | ICD-10-CM | POA: Insufficient documentation

## 2020-04-08 DIAGNOSIS — R45851 Suicidal ideations: Secondary | ICD-10-CM

## 2020-04-08 DIAGNOSIS — R0602 Shortness of breath: Secondary | ICD-10-CM | POA: Insufficient documentation

## 2020-04-08 DIAGNOSIS — F1010 Alcohol abuse, uncomplicated: Secondary | ICD-10-CM

## 2020-04-08 LAB — COMPREHENSIVE METABOLIC PANEL
ALT: 13 U/L (ref 0–44)
AST: 19 U/L (ref 15–41)
Albumin: 4.7 g/dL (ref 3.5–5.0)
Alkaline Phosphatase: 56 U/L (ref 38–126)
Anion gap: 12 (ref 5–15)
BUN: 9 mg/dL (ref 6–20)
CO2: 22 mmol/L (ref 22–32)
Calcium: 9.3 mg/dL (ref 8.9–10.3)
Chloride: 103 mmol/L (ref 98–111)
Creatinine, Ser: 0.91 mg/dL (ref 0.61–1.24)
GFR calc Af Amer: >60 mL/min (ref >60)
GFR calc non Af Amer: >60 mL/min (ref >60)
Glucose, Bld: 93 mg/dL (ref 70–99)
Potassium: 3.3 mmol/L — ABNORMAL LOW (ref 3.5–5.1)
Sodium: 137 mmol/L (ref 135–145)
Total Bilirubin: 1 mg/dL (ref 0.3–1.2)
Total Protein: 8.1 g/dL (ref 6.5–8.1)

## 2020-04-08 LAB — URINE DRUG SCREEN, QUALITATIVE (ARMC ONLY)
Amphetamines, Ur Screen: NOT DETECTED
Barbiturates, Ur Screen: NOT DETECTED
Benzodiazepine, Ur Scrn: NOT DETECTED
Cannabinoid 50 Ng, Ur ~~LOC~~: POSITIVE — AB
Cocaine Metabolite,Ur ~~LOC~~: NOT DETECTED
MDMA (Ecstasy)Ur Screen: NOT DETECTED
Methadone Scn, Ur: NOT DETECTED
Opiate, Ur Screen: NOT DETECTED
Phencyclidine (PCP) Ur S: NOT DETECTED
Tricyclic, Ur Screen: NOT DETECTED

## 2020-04-08 LAB — CBC
HCT: 47 % (ref 39.0–52.0)
Hemoglobin: 16.6 g/dL (ref 13.0–17.0)
MCH: 31.6 pg (ref 26.0–34.0)
MCHC: 35.3 g/dL (ref 30.0–36.0)
MCV: 89.4 fL (ref 80.0–100.0)
Platelets: 244 10*3/uL (ref 150–400)
RBC: 5.26 MIL/uL (ref 4.22–5.81)
RDW: 13 % (ref 11.5–15.5)
WBC: 9.3 10*3/uL (ref 4.0–10.5)
nRBC: 0 % (ref 0.0–0.2)

## 2020-04-08 LAB — ACETAMINOPHEN LEVEL: Acetaminophen (Tylenol), Serum: <10 ug/mL — ABNORMAL LOW (ref 10–30)

## 2020-04-08 LAB — SARS CORONAVIRUS 2 BY RT PCR (HOSPITAL ORDER, PERFORMED IN ~~LOC~~ HOSPITAL LAB): SARS Coronavirus 2: NEGATIVE

## 2020-04-08 LAB — ETHANOL: Alcohol, Ethyl (B): 197 mg/dL — ABNORMAL HIGH (ref <10)

## 2020-04-08 LAB — SALICYLATE LEVEL: Salicylate Lvl: <7 mg/dL — ABNORMAL LOW (ref 7.0–30.0)

## 2020-04-08 MED ORDER — THIAMINE HCL 100 MG PO TABS
100.0000 mg | ORAL_TABLET | Freq: Every day | ORAL | Status: DC
  Administered 2020-04-08 – 2020-04-09 (×2): 100 mg via ORAL
  Filled 2020-04-08 (×2): qty 1

## 2020-04-08 MED ORDER — LORAZEPAM 2 MG PO TABS
0.0000 mg | ORAL_TABLET | Freq: Four times a day (QID) | ORAL | Status: DC
  Administered 2020-04-08 – 2020-04-09 (×2): 2 mg via ORAL
  Filled 2020-04-08 (×2): qty 1

## 2020-04-08 MED ORDER — THIAMINE HCL 100 MG/ML IJ SOLN: 100.0000 mg | Freq: Every day | INTRAMUSCULAR | Status: DC

## 2020-04-08 MED ORDER — LORAZEPAM 2 MG PO TABS: 0.0000 mg | ORAL_TABLET | Freq: Two times a day (BID) | ORAL | Status: DC

## 2020-04-08 MED ORDER — LORAZEPAM 2 MG/ML IJ SOLN: 0.0000 mg | Freq: Two times a day (BID) | INTRAMUSCULAR | Status: DC

## 2020-04-08 MED ORDER — LORAZEPAM 2 MG/ML IJ SOLN: 0.0000 mg | Freq: Four times a day (QID) | INTRAMUSCULAR | Status: DC

## 2020-04-08 NOTE — ED Notes (Signed)
Pt. Currently resting with eyes closed. Respirations even and non labored. Will continue to monitor.

## 2020-04-08 NOTE — ED Notes (Signed)
Pt. Alert and oriented, warm and dry, in no distress. Pt. Denies SI, HI, and AVH. Pt states he wants to leave. This Clinical research associate explained to patient he is unable to leave due to being IVC'ed by EDP. Patient requesting to speak with TTS.  Pt. Encouraged to let nursing staff know of any concerns or needs.

## 2020-04-08 NOTE — ED Notes (Signed)

## 2020-04-08 NOTE — ED Notes (Signed)
Pt brought into ED BHU via sally port and wand with metal detector for safety by  Security officer. Patient oriented to unit/care area: Pt informed of unit policies and procedures.  Informed that, for their safety, care areas are designed for safety and monitored by security cameras at all times. Patient verbalizes understanding, and verbal contract for safety obtained.Pt shown to their room.  

## 2020-04-08 NOTE — ED Notes (Signed)
IVC, pend psych consult 

## 2020-04-08 NOTE — ED Triage Notes (Addendum)
Arrives with voluntary with law enforcement.  Patient states "first of all I am drunk.  Second, I have had diarrhea x 4 days and body aches."  Also states "My life is falling apart.  I probably just got fired last Thursday.  I have had some suicidal thoughts.  I feel like getting violent or just cutting it all off."  Patient is AAOx3.  NAD

## 2020-04-08 NOTE — ED Provider Notes (Signed)
[facility] Emergency Department Provider Note   ____________________________________________   First MD Initiated Contact with Patient 04/08/20 1529     (approximate)  I have reviewed the triage vital signs and the nursing notes.   HISTORY  Chief Complaint Suicidal    HPI Paul Booker is a 34 y.o. male with possible history of Asperger's disorder, alcohol abuse, and adjustment disorder who presents to the ED complaining of diarrhea and suicidal ideation.  Patient reports that he has had diarrhea for 4 days as well as malaise, body aches, headache, and nausea.  He states he received the [employer] vaccine multiple months ago and is not aware of any sick contacts.  He is concerned that he could have Covid 19, but also states he is having intrusive suicidal thoughts.  He stated he would want to (cut himself off from the world) if he were to have Covid, reports he has "multiple plans" as to what he would do, but refuses to specify.  He was brought to the ED by law enforcement and is currently voluntary.  He does admit to drinking alcohol on a daily basis with multiple drinks earlier today.        Past Medical History:  Diagnosis Date  . Asperger's disorder   . Syncope and collapse     Patient Active Problem List   Diagnosis Date Noted  . Alcohol abuse 12/03/2019  . 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 Malingering 08/11/2019    Past Surgical History:  Procedure Laterality Date  . adnoids    . adnoids removed      Prior to Admission medications   Not on File    Allergies Concerta [methylphenidate]  Family History  Problem Relation Age of Onset  . Heart disease Mother   . Hypertension Mother   . Diabetes Father   . Asthma Brother     Social History Social History   Tobacco Use  . Smoking status: Current Every Day Smoker    Packs/day: 1.00    Years: 11.00    Pack years: 11.00     Types: Cigarettes  . Smokeless tobacco: Never Used  Substance Use Topics  . Alcohol use: Yes  . Drug use: Yes    Types: Marijuana, Cocaine, Methamphetamines    Comment: twice a week    Review of Systems  Constitutional: Positive for subjective fever/chills Eyes: No visual changes. ENT: No sore throat. Cardiovascular: Denies chest pain. Respiratory: Positive for cough and shortness of breath. Gastrointestinal: No abdominal pain.  Positive for nausea, no vomiting.  Positive for diarrhea.  No constipation. Genitourinary: Negative for dysuria. Musculoskeletal: Negative for back pain. Skin: Negative for rash. Neurological: Negative for headaches, focal weakness or numbness.  Positive for suicidal ideation.  ____________________________________________   PHYSICAL EXAM:  VITAL SIGNS: ED Triage Vitals  Enc Vitals Group     BP 04/08/20 1514 (!) 137/106     Pulse Rate 04/08/20 1514 78     Resp 04/08/20 1514 16     Temp 04/08/20 1514 98.6 F (37 C)     Temp Source 04/08/20 1514 Oral     SpO2 04/08/20 1514 100 %     Weight 04/08/20 1515 179 lb 14.3 oz (81.6 kg)     Height 04/08/20 1515 6\' 3"  (1.905 m)     Head Circumference --      Peak Flow --      Pain Score 04/08/20 1515 0  Pain Loc --      Pain Edu? --      Excl. in GC? --     Constitutional: Alert and oriented.  Intoxicated appearing. Eyes: Conjunctivae are normal. Head: Atraumatic. Nose: No congestion/rhinnorhea. Mouth/Throat: Mucous membranes are moist. Neck: Normal ROM Cardiovascular: Normal rate, regular rhythm. Grossly normal heart sounds. Respiratory: Normal respiratory effort.  No retractions. Lungs CTAB. Gastrointestinal: Soft and nontender. No distention. Genitourinary: deferred Musculoskeletal: No lower extremity tenderness nor edema. Neurologic:  Normal speech and language. No gross focal neurologic deficits are appreciated. Skin:  Skin is warm, dry and intact. No rash noted. Psychiatric: Mood and  affect are normal. Speech and behavior are normal.  ____________________________________________   LABS (all labs ordered are listed, but only abnormal results are displayed)  Labs Reviewed  COMPREHENSIVE METABOLIC PANEL - Abnormal; Notable for the following components:      Result Value   Potassium 3.3 (*)    All other components within normal limits  ETHANOL - Abnormal; Notable for the following components:   Alcohol, Ethyl (B) 197 (*)    All other components within normal limits  SALICYLATE LEVEL - Abnormal; Notable for the following components:   Salicylate Lvl <7.0 (*)    All other components within normal limits  ACETAMINOPHEN LEVEL - Abnormal; Notable for the following components:   Acetaminophen (Tylenol), Serum <10 (*)    All other components within normal limits  URINE DRUG SCREEN, QUALITATIVE ([facility] ONLY) - Abnormal; Notable for the following components:   Cannabinoid 50 Ng, Ur [place] POSITIVE (*)    All other components within normal limits  SARS CORONAVIRUS 2 BY RT PCR (HOSPITAL ORDER, PERFORMED IN [facility])  CBC   ____________________________________________  EKG  ED ECG REPORT I, Chesley Noon, the attending physician, personally viewed and interpreted this ECG.   Date: 04/08/2020  EKG Time: 16:28  Rate: 71  Rhythm: normal sinus rhythm  Axis: Normal  Intervals:none  ST&T Change: none   PROCEDURES  Procedure(s) performed (including Critical Care):  Procedures   ____________________________________________   INITIAL IMPRESSION / ASSESSMENT AND PLAN / ED COURSE       34 year old male with medical history of Asperger's, alcohol abuse, and adjustment disorder who presents to the ED for diarrhea, malaise, headache, and nausea over the past couple of days with concerns that he is Covid positive.  He does endorse some shortness of breath but is not in any respiratory distress and is maintaining O2 sats on room air.  We will check EKG and  chest x-ray, but lab work is unremarkable thus far, patient does not appear dehydrated.  He was placed under IVC due to his suicidal ideation with plan that he refuses to elaborate.  Patient may be medically cleared following EKG and chest x-ray for psychiatric evaluation.  EKG and chest x-ray are unremarkable, lab work also reassuring outside of elevated blood alcohol.  Patient may be medically cleared for psychiatric evaluation.  The patient has been placed in psychiatric observation due to the need to provide a safe environment for the patient while obtaining psychiatric consultation and evaluation, as well as ongoing medical and medication management to treat the patient's condition.  The patient has been placed under full IVC at this time.       ____________________________________________   FINAL CLINICAL IMPRESSION(S) / ED DIAGNOSES  Final diagnoses:  Suicidal ideation  Alcohol abuse     ED Discharge Orders    None  Note:  This document was prepared using Dragon voice recognition software and may include unintentional dictation errors.   Chesley Noon, MD 04/08/20 2015

## 2020-04-08 NOTE — ED Notes (Signed)
ED Provider at bedside. 

## 2020-04-08 NOTE — BH Assessment (Addendum)
TTS attempted to complete assessment with pt but was unsuccessful due to pt refusing to participate. Pt states "I really do not want to talk to you, I don't like you or your personality, you're making me want to fight, please get out". TTS offered to come back later pt states "no I do not want to talk you".   TTS provided an update to pt's current MD, Larinda Buttery and RN, Amber   Night TTS will be updated

## 2020-04-08 NOTE — ED Notes (Signed)
Patient Belongings:  $36 cash in wallet.   Cell phone Keys Green pants cigarettes Psychologist, prison and probation services Black shoes Blue shirt with tent pattern

## 2020-04-09 NOTE — BH Assessment (Signed)
Assessment Note  Paul Booker is an 34 y.o. male who presents to [facility] ED involuntarily for treatment. Per triage note, Arrives with voluntary with law enforcement.  Patient states "first of all I am drunk.  Second, I have had diarrhea x 4 days and body aches."  Also states "My life is falling apart.  I probably just got fired last Thursday.  I have had some suicidal thoughts.  I feel like getting violent or just cutting it all off." Patient is AAOx3. NAD  During TTS assessment pt presents alert, verbally aggressive, irritable, uncooperative, oriented x 3 and mood-congruent with affect. The pt does not appear to be responding to internal or external stimuli. Neither is the pt presenting with any delusional thinking. Pt verified believing he has COVID and will die from it. In attempt to explore his current psychiatric symptom reported in IVC pt randomly states, I want you to leave, I do not want to talk to you, your personality and energy is making me want to get violent. Pt presented the same during TTS attempt to access him yesterday. TTS explained the importance of participating in the assessment to receive support, pt states I dont care, if I dont talk to you I can leave and if I do talk to you I will still probably leave so I win either way. Pt angrily requested to be discharged and grew increasing agitated when informed about IVC. To avoid pt escalating any further TTS stopped assessment. Pt is unable to confirmed/deny SI/HI/AH/VH or contract for safety at this time. Pt also refused to provide collateral.   Per pt's chart, pt has a hx of SI, alcohol intoxication/abuse, adjustment disorder and Asperger's syndrome but per TTS assessment appears to be high functioning.    Per IVC: Pt reports SI with a plan that he refuses to elaborate on.   Disposition is pending psych consult    Diagnosis: Not enough information  Past Medical History:  Past Medical History:  Diagnosis Date   Asperger's  disorder    Syncope and collapse     Past Surgical History:  Procedure Laterality Date   adnoids     adnoids removed      Family History:  Family History  Problem Relation Age of Onset   Heart disease Mother    Hypertension Mother    Diabetes Father    Asthma Brother     Social History:  reports that he has been smoking cigarettes. He has a 11.00 pack-year smoking history. He has never used smokeless tobacco. He reports current alcohol use. He reports current drug use. Drugs: Marijuana, Cocaine, and Methamphetamines.  Additional Social History:  Alcohol / Drug Use Pain Medications: see mar Prescriptions: see mar Over the Counter: see mar History of alcohol / drug use?: Yes Substance #1 Name of Substance 1: alcohol  CIWA: CIWA-Ar BP: 103/71 Pulse Rate: 94 Nausea and Vomiting: 2 Tactile Disturbances: none Tremor: no tremor Auditory Disturbances: not present Paroxysmal Sweats: two Visual Disturbances: not present Anxiety: moderately anxious, or guarded, so anxiety is inferred Headache, Fullness in Head: none present Agitation: two Orientation and Clouding of Sensorium: oriented and can do serial additions CIWA-Ar Total: 10 COWS:    Allergies:  Allergies  Allergen Reactions   Concerta [Methylphenidate]     Tongue swelling    Home Medications: (Not in a hospital admission)   OB/GYN Status:  No LMP for male patient.  General Assessment Data Location of Assessment: [facility] ED TTS Assessment: In system Is this  a Tele or Face-to-Face Assessment?: Tele Assessment Is this an Initial Assessment or a Re-assessment for this encounter?: Initial Assessment Patient Accompanied by:: N/A Language Other than English: No Living Arrangements: Other (Comment) What gender do you identify as?: Male Date Telepsych consult ordered in [facility]: 04/08/20 Time Telepsych consult ordered in [facility]: 1457 Marital status: Single Maiden name: n/a Pregnancy Status: No Living  Arrangements: Other (Comment) (unknown ) Can pt return to current living arrangement?: Yes Admission Status: Involuntary Petitioner: ED Attending Is patient capable of signing voluntary admission?: Yes Referral Source: Self/Family/Friend Insurance type: None      Crisis Care Plan Living Arrangements: Other (Comment) (unknown ) Legal Guardian:  (self ) Name of Psychiatrist: UTA Name of Therapist: UTA  Education Status Is patient currently in school?: No Is the patient employed, unemployed or receiving disability?: Employed (PT refsued to share where )  Risk to self with the past 6 months Suicidal Ideation:  (uta) Has patient been a risk to self within the past 6 months prior to admission? : Yes Suicidal Intent:  (uta) Has patient had any suicidal intent within the past 6 months prior to admission? :  (uta) Is patient at risk for suicide?:  (uta) Suicidal Plan?:  (uta) Has patient had any suicidal plan within the past 6 months prior to admission? :  (uta) Access to Means:  Rich Reining) What has been your use of drugs/alcohol within the last 12 months?: alcohol Previous Attempts/Gestures: Yes How many times?: 3 Other Self Harm Risks: uta Triggers for Past Attempts: Unknown Intentional Self Injurious Behavior:  (uta) Family Suicide History: Unable to assess Recent stressful life event(s):  (uta) Persecutory voices/beliefs?:  Rich Reining) Depression: Yes Depression Symptoms: Feeling angry/irritable Substance abuse history and/or treatment for substance abuse?:  (uta) Suicide prevention information given to non-admitted patients: Yes  Risk to Others within the past 6 months Homicidal Ideation:  (uta) Does patient have any lifetime risk of violence toward others beyond the six months prior to admission? : Unknown Thoughts of Harm to Others:  Rich Reining) Current Homicidal Intent:  (uta) Current Homicidal Plan:  (uta) Access to Homicidal Means:  (uta) History of harm to others?:   (uta) Assessment of Violence: None Noted Violent Behavior Description: n/a Does patient have access to weapons?:  Rich Reining) Criminal Charges Pending?:  (uta) Does patient have a court date:  ([place]) Is patient on probation?: Unknown  Psychosis Hallucinations:  (uta) Delusions:  Rich Reining)  Mental Status Report Appearance/Hygiene: In scrubs Eye Contact: Poor Motor Activity: Freedom of movement Speech: Argumentative, Logical/coherent Level of Consciousness: Alert Mood: Depressed, Irritable, Anxious Affect: Depressed, Anxious Anxiety Level: Minimal Thought Processes: Coherent, Relevant Judgement: Partial Orientation: Appropriate for developmental age Obsessive Compulsive Thoughts/Behaviors: Minimal (DYING FROM COVID)  Cognitive Functioning Concentration: Unable to Assess Memory: Recent Intact, Remote Intact Is patient IDD: No Insight: Poor Impulse Control: Poor Appetite:  (uta) Have you had any weight changes? :  (uta) Sleep: Unable to Assess Total Hours of Sleep:  (uta) Vegetative Symptoms: None  ADLScreening [facility] Assessment Services) Patient's cognitive ability adequate to safely complete daily activities?: Yes Patient able to express need for assistance with ADLs?: Yes  Prior Inpatient Therapy Prior Inpatient Therapy:  Rich Reining)  Prior Outpatient Therapy Prior Outpatient Therapy:  Rich Reining)  ADL Screening (condition at time of admission) Patient's cognitive ability adequate to safely complete daily activities?: Yes Is the patient deaf or have difficulty hearing?: No Does the patient have difficulty seeing, even when wearing glasses/contacts?: No Does the patient have difficulty concentrating, remembering,  or making decisions?: No Patient able to express need for assistance with ADLs?: Yes Does the patient have difficulty dressing or bathing?: No Does the patient have difficulty walking or climbing stairs?: No Weakness of Legs: None Weakness of Arms/Hands: None  Home Assistive  Devices/Equipment Home Assistive Devices/Equipment: None  Therapy Consults (therapy consults require a physician order) PT Evaluation Needed: No OT Evalulation Needed: No SLP Evaluation Needed: No Abuse/Neglect Assessment (Assessment to be complete while patient is alone) Abuse/Neglect Assessment Can Be Completed: Unable to assess, patient is non-responsive or altered mental status Values / Beliefs Cultural Requests During Hospitalization: None Spiritual Requests During Hospitalization: None Consults Spiritual Care Consult Needed: No Transition of Care Team Consult Needed: No Advance Directives (For Healthcare) Does Patient Have a Medical Advance Directive?: No Would patient like information on creating a medical advance directive?: No - Patient declined          Disposition:  Disposition Initial Assessment Completed for this Encounter: Yes Patient referred to: Other (Comment)  On Site Evaluation by:   Reviewed with Physician:    Opal Sidles 04/09/2020 9:16 AM

## 2020-04-09 NOTE — ED Provider Notes (Signed)
Pt cleared by psych no new meds recommended. Patient is ready for discharge.   Arnaldo Natal, MD 04/09/20 [phone]

## 2020-04-09 NOTE — Discharge Instructions (Addendum)
Please try not to drink too much. AA may be able to help you with that. Please return as needed.

## 2020-04-09 NOTE — Final Progress Note (Addendum)
Physician Final Progress Note  Patient ID: Paul Booker MRN: [id number] DOB/AGE: [birth year] 34 y.o.  Admit date: 04/08/2020 Admitting provider: No admitting provider for patient encounter. Discharge date: 04/09/2020   Admission Diagnoses:    ETOH intoxication Adjustment issues Past history of depression   Discharge Diagnoses:  Active Problems:   * No active hospital problems. *   Same   Consults:  TTS  ER MD PSYCH MD   Significant Findings/ Diagnostic Studies: none Procedures:   Discharge Condition: fair  Disposition:  back to his house via cab   Diet: {Regular Discharge Activity: as   Activity as tolerated ---   Patient feels stable post intoxication He has not had any SI and HI --- Contracts for safety wants to go home   Oriented times four Not clouded or fluctuant Mood and affect okay No shakes tremors or tics Appearance haggard unkept Thought process and content --normal Speech norma; Judgement insight reliability fair Intelligence and fund of knowledge fair SI and HI none Abstraction okay Concentration and attention okay Memory normal   Leans not known Handedness not known Musculoskeletal okay  Gait and station normal Assets not clear Recall normal Psychomotor normal        Total time spent taking care of this patient: 40   minutes  Signed: Roselind Messier 04/09/2020, 2:43 PM

## 2020-04-09 NOTE — ED Notes (Signed)
Papers rescinded, pending D/C 

## 2020-04-09 NOTE — ED Notes (Signed)
Pt discharging home. Discharge teaching done and pt verbalized understanding. Pt signed paper form. Given all his personal belongings. Escorted to lobby, ambulatory and NAD.

## 2020-04-09 NOTE — ED Provider Notes (Signed)
Emergency Medicine Observation Re-evaluation Note  Paul Booker is a 34 y.o. male, seen on rounds today.  Pt initially presented to the ED for complaints of Suicidal Currently, the patient is resting.  Physical Exam  BP 103/71 (BP Location: Left Arm)   Pulse 94   Temp 98.3 F (36.8 C) (Oral)   Resp 16   Ht 1.905 m (6\' 3" )   Wt 81.6 kg   SpO2 99%   BMI 22.49 kg/m  Physical Exam Gen:  No acute distress Resp:  Breathing easily and comfortably, no accessory muscle usage Neuro:  Moving all four extremities, no gross focal neuro deficits Psych:  Resting currently, but irritable and gets agitated when people try to talk with him  ED Course / MDM  EKG:    I have reviewed the labs performed to date as well as medications administered while in observation.  Recent changes in the last 24 hours include medical clearance and ordering of psych consult.  Plan  Current plan is for psych assessment and disposition plan.. Patient is under full IVC at this time.   , MD 04/09/20 [phone]
